# Patient Record
Sex: Female | Born: 1950 | Race: Black or African American | Hispanic: No | Marital: Single | Smoking: Never smoker
Health system: Southern US, Community
[De-identification: ages and names within clinical notes are randomized; demographics above are authoritative.]

## PROBLEM LIST (undated history)

## (undated) DIAGNOSIS — M1990 Unspecified osteoarthritis, unspecified site: Secondary | ICD-10-CM

## (undated) DIAGNOSIS — K219 Gastro-esophageal reflux disease without esophagitis: Secondary | ICD-10-CM

## (undated) DIAGNOSIS — I10 Essential (primary) hypertension: Secondary | ICD-10-CM

## (undated) DIAGNOSIS — B9689 Other specified bacterial agents as the cause of diseases classified elsewhere: Secondary | ICD-10-CM

## (undated) DIAGNOSIS — T7840XA Allergy, unspecified, initial encounter: Secondary | ICD-10-CM

## (undated) DIAGNOSIS — N760 Acute vaginitis: Secondary | ICD-10-CM

## (undated) HISTORY — DX: Unspecified osteoarthritis, unspecified site: M19.90

## (undated) HISTORY — DX: Gastro-esophageal reflux disease without esophagitis: K21.9

## (undated) HISTORY — PX: TOTAL VAGINAL HYSTERECTOMY: SHX2548

## (undated) HISTORY — DX: Allergy, unspecified, initial encounter: T78.40XA

## (undated) HISTORY — DX: Essential (primary) hypertension: I10

---
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
## 2012-05-01 ENCOUNTER — Encounter: Payer: Self-pay | Admitting: Obstetrics & Gynecology

## 2012-05-22 ENCOUNTER — Encounter: Payer: Self-pay | Admitting: Obstetrics & Gynecology

## 2012-05-22 ENCOUNTER — Ambulatory Visit (INDEPENDENT_AMBULATORY_CARE_PROVIDER_SITE_OTHER): Payer: Self-pay | Admitting: Obstetrics & Gynecology

## 2012-05-22 VITALS — BP 132/85 | HR 80 | Temp 98.3°F | Ht 63.0 in | Wt 231.9 lb

## 2012-05-22 DIAGNOSIS — B9689 Other specified bacterial agents as the cause of diseases classified elsewhere: Secondary | ICD-10-CM

## 2012-05-22 DIAGNOSIS — A499 Bacterial infection, unspecified: Secondary | ICD-10-CM

## 2012-05-22 DIAGNOSIS — N760 Acute vaginitis: Secondary | ICD-10-CM

## 2012-05-22 MED ORDER — CLINDAMYCIN HCL 150 MG PO CAPS: 150.0000 mg | ORAL_CAPSULE | Freq: Three times a day (TID) | ORAL | Status: DC

## 2012-05-22 NOTE — Progress Notes (Signed)
Patient ID: Leah Aguilar, female   DOB: [birth year], 62 y.o.   MRN: [id number]  Chief Complaint  Patient presents with  . Vaginal Discharge    Referral [facility]    HPI Leah Aguilar is a 62 y.o. female.  Chronic vaginal discharge for many years, previously treated for BV with metronidazole.  HPI  Past Medical History  Diagnosis Date  . Hypertension   . GERD (gastroesophageal reflux disease)   . Allergy   . Arthritis     Past Surgical History  Procedure Laterality Date  . Appendectomy  1958  . Total vaginal hysterectomy    D&C for Sab x2 BTL postpartum 1979 1985 [place]  Family History  Problem Relation Age of Onset  . Hypertension Mother   . Arthritis Mother   . COPD Mother   . Hypertension Father   . Cancer Father   . Arthritis Father     Social History History  Substance Use Topics  . Smoking status: Never Smoker   . Smokeless tobacco: Never Used  . Alcohol Use: No    No Known Allergies  Current Outpatient Prescriptions  Medication Sig Dispense Refill  . albuterol (PROVENTIL HFA;VENTOLIN HFA) 108 (90 BASE) MCG/ACT inhaler Inhale 2 puffs into the lungs every 6 (six) hours as needed for wheezing.      . clindamycin (CLEOCIN) 150 MG capsule Take 1 capsule (150 mg total) by mouth 3 (three) times daily.  21 capsule  0  . esomeprazole (NEXIUM) 20 MG capsule Take 20 mg by mouth daily before breakfast.      . Fluticasone-Salmeterol (ADVAIR DISKUS) 100-50 MCG/DOSE AEPB Inhale 1 puff into the lungs every 12 (twelve) hours.      Marland Kitchen PRESCRIPTION MEDICATION 2 tablets daily. Blood pressure - not sure name       No current facility-administered medications for this visit.    Review of Systems Review of Systems  Gastrointestinal: Negative for abdominal pain.  Genitourinary: Positive for vaginal discharge (odor). Negative for dysuria, vaginal bleeding, pelvic pain and dyspareunia.    Blood pressure 132/85, pulse 80, temperature  98.3 F (36.8 C), height 5\' 3"  (1.6 m), weight 231 lb 14.4 oz (105.189 kg).  Physical Exam Physical Exam  Constitutional: She is oriented to person, place, and time. She appears well-developed and well-nourished.  obese  Pulmonary/Chest: Effort normal. No respiratory distress.  Genitourinary: Vaginal discharge found.  Scant DC and odor Cuff normal and no mass Affirm test done  Neurological: She is alert and oriented to person, place, and time.  Skin: Skin is warm and dry.  Psychiatric: She has a normal mood and affect. Her behavior is normal.    Data Reviewed Referral notes  Assessment    Suspect BV     Plan    Clindamycin 150 mg TID 7 days        Leah Aguilar 05/22/2012, 3:54 PM

## 2012-05-22 NOTE — Patient Instructions (Signed)
Bacterial Vaginosis Bacterial vaginosis (BV) is a vaginal infection where the normal balance of bacteria in the vagina is disrupted. The normal balance is then replaced by an overgrowth of certain bacteria. There are several different kinds of bacteria that can cause BV. BV is the most common vaginal infection in women of childbearing age. CAUSES   The cause of BV is not fully understood. BV develops when there is an increase or imbalance of harmful bacteria.  Some activities or behaviors can upset the normal balance of bacteria in the vagina and put women at increased risk including:  Having a new sex partner or multiple sex partners.  Douching.  Using an intrauterine device (IUD) for contraception.  It is not clear what role sexual activity plays in the development of BV. However, women that have never had sexual intercourse are rarely infected with BV. Women do not get BV from toilet seats, bedding, swimming pools or from touching objects around them.  SYMPTOMS   Grey vaginal discharge.  A fish-like odor with discharge, especially after sexual intercourse.  Itching or burning of the vagina and vulva.  Burning or pain with urination.  Some women have no signs or symptoms at all. DIAGNOSIS  Your caregiver must examine the vagina for signs of BV. Your caregiver will perform lab tests and look at the sample of vaginal fluid through a microscope. They will look for bacteria and abnormal cells (clue cells), a pH test higher than 4.5, and a positive amine test all associated with BV.  RISKS AND COMPLICATIONS   Pelvic inflammatory disease (PID).  Infections following gynecology surgery.  Developing HIV.  Developing herpes virus. TREATMENT  Sometimes BV will clear up without treatment. However, all women with symptoms of BV should be treated to avoid complications, especially if gynecology surgery is planned. Female partners generally do not need to be treated. However, BV may spread  between female sex partners so treatment is helpful in preventing a recurrence of BV.   BV may be treated with antibiotics. The antibiotics come in either pill or vaginal cream forms. Either can be used with nonpregnant or pregnant women, but the recommended dosages differ. These antibiotics are not harmful to the baby.  BV can recur after treatment. If this happens, a second round of antibiotics will often be prescribed.  Treatment is important for pregnant women. If not treated, BV can cause a premature delivery, especially for a pregnant woman who had a premature birth in the past. All pregnant women who have symptoms of BV should be checked and treated.  For chronic reoccurrence of BV, treatment with a type of prescribed gel vaginally twice a week is helpful. HOME CARE INSTRUCTIONS   Finish all medication as directed by your caregiver.  Do not have sex until treatment is completed.  Tell your sexual partner that you have a vaginal infection. They should see their caregiver and be treated if they have problems, such as a mild rash or itching.  Practice safe sex. Use condoms. Only have 1 sex partner. PREVENTION  Basic prevention steps can help reduce the risk of upsetting the natural balance of bacteria in the vagina and developing BV:  Do not have sexual intercourse (be abstinent).  Do not douche.  Use all of the medicine prescribed for treatment of BV, even if the signs and symptoms go away.  Tell your sex partner if you have BV. That way, they can be treated, if needed, to prevent reoccurrence. SEEK MEDICAL CARE IF:     Your symptoms are not improving after 3 days of treatment.  You have increased discharge, pain, or fever. MAKE SURE YOU:   Understand these instructions.  Will watch your condition.  Will get help right away if you are not doing well or get worse. FOR MORE INFORMATION  Division of STD Prevention (DSTDP), Centers for Disease Control and Prevention:  www.cdc.gov/std American Social Health Association (ASHA): www.ashastd.org  Document Released: 03/13/2005 Document Revised: 06/05/2011 Document Reviewed: 09/03/2008 Exit[employer] Patient Information 2013 [employer].  

## 2012-05-22 NOTE — Progress Notes (Signed)
Referred her for c/o vaginal discharge for several years- states has been treated several times and it comes right back.

## 2012-05-22 NOTE — Addendum Note (Signed)
Addended by: Kathee Delton on: 05/22/2012 04:52 PM   Modules accepted: Orders

## 2012-06-27 ENCOUNTER — Telehealth: Payer: Self-pay | Admitting: *Deleted

## 2012-06-27 NOTE — Telephone Encounter (Signed)
Leah Aguilar called and left a message she was seen by Dr. Debroah Loop 05/22/12 and he said if the problem persisted she could get the medicine refilled. States she did the automated request on computer and it said call your doctor.  Per chart review was seen for persistent BV and given prescription for Clindamycin x 7 days.  Called Leah Aguilar and left a message we are calling back- please call clinic during office hours.

## 2012-07-03 NOTE — Telephone Encounter (Signed)
Called patient and received voice mail.  I left a message saying that we have some information for her and for her to give [place] a call back.  I also asked that if she gets our nurse line again to please leave in her message if it is ok or not for [place] to leave detailed information on her voicemail.

## 2012-07-03 NOTE — Telephone Encounter (Signed)
Patient called and left message stating she is returning our phone call and its okay to leave a detailed message on her voicemail

## 2012-07-04 NOTE — Telephone Encounter (Signed)
Left a voicemail for the patient informing her of her dx of persistent BV and that her medication had been called into the pharmacy of her choice.  I reviewed the instructions for taking the medication and stated if she had any further questions to please call us back.

## 2014-01-26 ENCOUNTER — Encounter: Payer: Self-pay | Admitting: Obstetrics & Gynecology

## 2014-12-16 ENCOUNTER — Encounter (HOSPITAL_BASED_OUTPATIENT_CLINIC_OR_DEPARTMENT_OTHER): Payer: Self-pay

## 2014-12-16 ENCOUNTER — Emergency Department (HOSPITAL_BASED_OUTPATIENT_CLINIC_OR_DEPARTMENT_OTHER)
Admission: EM | Admit: 2014-12-16 | Discharge: 2014-12-16 | Disposition: A | Discharge status: Home / Self Care | Payer: Medicare Other | Attending: Emergency Medicine | Admitting: Emergency Medicine

## 2014-12-16 DIAGNOSIS — Z792 Long term (current) use of antibiotics: Secondary | ICD-10-CM | POA: Diagnosis not present

## 2014-12-16 DIAGNOSIS — N390 Urinary tract infection, site not specified: Secondary | ICD-10-CM | POA: Diagnosis not present

## 2014-12-16 DIAGNOSIS — Z7951 Long term (current) use of inhaled steroids: Secondary | ICD-10-CM | POA: Diagnosis not present

## 2014-12-16 DIAGNOSIS — R300 Dysuria: Secondary | ICD-10-CM | POA: Diagnosis present

## 2014-12-16 DIAGNOSIS — I10 Essential (primary) hypertension: Secondary | ICD-10-CM | POA: Insufficient documentation

## 2014-12-16 DIAGNOSIS — L293 Anogenital pruritus, unspecified: Secondary | ICD-10-CM | POA: Diagnosis not present

## 2014-12-16 DIAGNOSIS — Z8739 Personal history of other diseases of the musculoskeletal system and connective tissue: Secondary | ICD-10-CM | POA: Insufficient documentation

## 2014-12-16 DIAGNOSIS — K219 Gastro-esophageal reflux disease without esophagitis: Secondary | ICD-10-CM | POA: Diagnosis not present

## 2014-12-16 DIAGNOSIS — Z8619 Personal history of other infectious and parasitic diseases: Secondary | ICD-10-CM | POA: Diagnosis not present

## 2014-12-16 DIAGNOSIS — Z79899 Other long term (current) drug therapy: Secondary | ICD-10-CM | POA: Diagnosis not present

## 2014-12-16 HISTORY — DX: Other specified bacterial agents as the cause of diseases classified elsewhere: B96.89

## 2014-12-16 HISTORY — DX: Acute vaginitis: N76.0

## 2014-12-16 LAB — URINE MICROSCOPIC-ADD ON

## 2014-12-16 LAB — URINALYSIS, ROUTINE W REFLEX MICROSCOPIC
BILIRUBIN URINE: NEGATIVE
Glucose, UA: NEGATIVE mg/dL
KETONES UR: 15 mg/dL — AB
NITRITE: NEGATIVE
Protein, ur: NEGATIVE mg/dL
SPECIFIC GRAVITY, URINE: 1.023 (ref 1.005–1.030)
UROBILINOGEN UA: 1 mg/dL (ref 0.0–1.0)
pH: 6.5 (ref 5.0–8.0)

## 2014-12-16 MED ORDER — NITROFURANTOIN MONOHYD MACRO 100 MG PO CAPS: 100.0000 mg | ORAL_CAPSULE | Freq: Two times a day (BID) | ORAL | Status: DC

## 2014-12-16 MED ORDER — NITROFURANTOIN MONOHYD MACRO 100 MG PO CAPS
100.0000 mg | ORAL_CAPSULE | Freq: Once | ORAL | Status: AC
  Administered 2014-12-16: 100 mg via ORAL
  Filled 2014-12-16: qty 1

## 2014-12-16 MED ORDER — PHENAZOPYRIDINE HCL 200 MG PO TABS: 200.0000 mg | ORAL_TABLET | Freq: Three times a day (TID) | ORAL | Status: DC

## 2014-12-16 MED ORDER — PHENAZOPYRIDINE HCL 100 MG PO TABS
200.0000 mg | ORAL_TABLET | Freq: Once | ORAL | Status: AC
  Administered 2014-12-16: 200 mg via ORAL
  Filled 2014-12-16: qty 2

## 2014-12-16 NOTE — ED Notes (Signed)
Pt c/o vaginal burning, itching and pain after having intercourse on 9/16. Pt states was tx'd for BV 1wk prior to this

## 2014-12-16 NOTE — ED Notes (Signed)
Pt verbalizes understanding of d/c instructions and denies any further needs at this time. 

## 2014-12-16 NOTE — ED Provider Notes (Signed)
CSN: [id number]     Arrival date & time 12/16/14  0113 History   First MD Initiated Contact with Patient 12/16/14 [phone]     Chief Complaint  Patient presents with  . Vaginal Itching     (Consider location/radiation/quality/duration/timing/severity/associated sxs/prior Treatment) Patient is a 64 y.o. female presenting with dysuria. The history is provided by the patient.  Dysuria Pain quality:  Burning Pain severity:  Moderate Onset quality:  Gradual Timing:  Constant Progression:  Unchanged Chronicity:  New Relieved by:  Nothing Worsened by:  Nothing tried Ineffective treatments:  None tried Urinary symptoms: no hematuria   Associated symptoms: no flank pain, no genital lesions and no vaginal discharge   Associated symptoms comment:  Suprapubic pain Risk factors: no hx of pyelonephritis     Past Medical History  Diagnosis Date  . Hypertension   . GERD (gastroesophageal reflux disease)   . Allergy   . Arthritis   . BV (bacterial vaginosis)    Past Surgical History  Procedure Laterality Date  . Appendectomy  1958  . Total vaginal hysterectomy     Family History  Problem Relation Age of Onset  . Hypertension Mother   . Arthritis Mother   . COPD Mother   . Hypertension Father   . Cancer Father   . Arthritis Father    Social History  Substance Use Topics  . Smoking status: Never Smoker   . Smokeless tobacco: Never Used  . Alcohol Use: No   OB History    Gravida Para Term Preterm AB TAB SAB Ectopic Multiple Living   Review of Systems  Genitourinary: Positive for dysuria. Negative for hematuria, flank pain, vaginal bleeding, vaginal discharge and menstrual problem.  All other systems reviewed and are negative.     Allergies  Review of patient's allergies indicates no known allergies.  Home Medications   Prior to Admission medications   Medication Sig Start Date End Date Taking? Authorizing Provider  albuterol (PROVENTIL  HFA;VENTOLIN HFA) 108 (90 BASE) MCG/ACT inhaler Inhale 2 puffs into the lungs every 6 (six) hours as needed for wheezing.    Historical Provider, MD  clindamycin (CLEOCIN) 150 MG capsule Take 1 capsule (150 mg total) by mouth 3 (three) times daily. 05/22/12   Adam Phenix, MD  esomeprazole (NEXIUM) 20 MG capsule Take 20 mg by mouth daily before breakfast.    Historical Provider, MD  Fluticasone-Salmeterol (ADVAIR DISKUS) 100-50 MCG/DOSE AEPB Inhale 1 puff into the lungs every 12 (twelve) hours.    Historical Provider, MD  PRESCRIPTION MEDICATION 2 tablets daily. Blood pressure - not sure name    Historical Provider, MD   BP 136/85 mmHg  Pulse 76  Temp(Src) 98.5 F (36.9 C) (Oral)  Resp 20  Ht  (1.6 m)  Wt 240 lb (108.863 kg)  BMI 42.52 kg/m2  SpO2 98% Physical Exam  Constitutional: She is oriented to person, place, and time. She appears well-developed and well-nourished. No distress.  HENT:  Head: Normocephalic and atraumatic.  Mouth/Throat: Oropharynx is clear and moist.  Eyes: Conjunctivae and EOM are normal. Pupils are equal, round, and reactive to light.  Neck: Normal range of motion. Neck supple.  Cardiovascular: Normal rate, regular rhythm and intact distal pulses.   Pulmonary/Chest: Effort normal and breath sounds normal. No respiratory distress. She has no wheezes. She has no rales.  Abdominal: Soft. Bowel sounds are increased. There is no tenderness. There  is no rebound and no guarding.  Musculoskeletal: Normal range of motion.  Neurological: She is alert and oriented to person, place, and time.  Skin: Skin is warm and dry.  Psychiatric: She has a normal mood and affect.    ED Course  Procedures (including critical care time) Labs Review Labs Reviewed  URINALYSIS, ROUTINE W REFLEX MICROSCOPIC (NOT AT [facility]) - Abnormal; Notable for the following:    APPearance CLOUDY (*)    Hgb urine dipstick SMALL (*)    Ketones, ur 15 (*)    Leukocytes, UA LARGE (*)    All other  components within normal limits  URINE MICROSCOPIC-ADD ON - Abnormal; Notable for the following:    Squamous Epithelial / LPF MANY (*)    Bacteria, UA FEW (*)    All other components within normal limits    Imaging Review No results found. I have personally reviewed and evaluated these images and lab results as part of my medical decision-making.   EKG Interpretation None      MDM   Final diagnoses:  None    UTI, as she states this is different than her BV and is on medication for same will treat for UTI and have patient follow up with her PMD.      April Palumbo, MD 12/16/14 1610

## 2016-04-12 ENCOUNTER — Encounter (HOSPITAL_BASED_OUTPATIENT_CLINIC_OR_DEPARTMENT_OTHER): Payer: Self-pay | Admitting: Emergency Medicine

## 2016-04-12 ENCOUNTER — Emergency Department (HOSPITAL_BASED_OUTPATIENT_CLINIC_OR_DEPARTMENT_OTHER): Payer: Medicare Other

## 2016-04-12 ENCOUNTER — Emergency Department (HOSPITAL_BASED_OUTPATIENT_CLINIC_OR_DEPARTMENT_OTHER)
Admission: EM | Admit: 2016-04-12 | Discharge: 2016-04-12 | Disposition: A | Discharge status: Home / Self Care | Payer: Medicare Other | Attending: Emergency Medicine | Admitting: Emergency Medicine

## 2016-04-12 DIAGNOSIS — R509 Fever, unspecified: Secondary | ICD-10-CM | POA: Diagnosis not present

## 2016-04-12 DIAGNOSIS — R062 Wheezing: Secondary | ICD-10-CM | POA: Diagnosis not present

## 2016-04-12 DIAGNOSIS — Z79899 Other long term (current) drug therapy: Secondary | ICD-10-CM | POA: Diagnosis not present

## 2016-04-12 DIAGNOSIS — R05 Cough: Secondary | ICD-10-CM | POA: Insufficient documentation

## 2016-04-12 DIAGNOSIS — I10 Essential (primary) hypertension: Secondary | ICD-10-CM | POA: Insufficient documentation

## 2016-04-12 DIAGNOSIS — R6889 Other general symptoms and signs: Secondary | ICD-10-CM

## 2016-04-12 DIAGNOSIS — R112 Nausea with vomiting, unspecified: Secondary | ICD-10-CM | POA: Diagnosis not present

## 2016-04-12 LAB — BASIC METABOLIC PANEL
ANION GAP: 7 (ref 5–15)
BUN: 9 mg/dL (ref 6–20)
CALCIUM: 8.8 mg/dL — AB (ref 8.9–10.3)
CO2: 22 mmol/L (ref 22–32)
Chloride: 107 mmol/L (ref 101–111)
Creatinine, Ser: 0.72 mg/dL (ref 0.44–1.00)
GFR calc Af Amer: >60 mL/min (ref >60)
GLUCOSE: 130 mg/dL — AB (ref 65–99)
Potassium: 3.4 mmol/L — ABNORMAL LOW (ref 3.5–5.1)
SODIUM: 136 mmol/L (ref 135–145)

## 2016-04-12 LAB — CBC WITH DIFFERENTIAL/PLATELET
Basophils Absolute: 0 10*3/uL (ref 0.0–0.1)
Basophils Relative: 0 %
Eosinophils Absolute: 0 10*3/uL (ref 0.0–0.7)
Eosinophils Relative: 0 %
HCT: 35.4 % — ABNORMAL LOW (ref 36.0–46.0)
Hemoglobin: 11.7 g/dL — ABNORMAL LOW (ref 12.0–15.0)
Lymphocytes Relative: 16 %
Lymphs Abs: 0.8 10*3/uL (ref 0.7–4.0)
MCH: 31.5 pg (ref 26.0–34.0)
MCHC: 33.1 g/dL (ref 30.0–36.0)
MCV: 95.2 fL (ref 78.0–100.0)
Monocytes Absolute: 0.4 10*3/uL (ref 0.1–1.0)
Monocytes Relative: 8 %
Neutro Abs: 4 10*3/uL (ref 1.7–7.7)
Neutrophils Relative %: 76 %
Platelets: 256 10*3/uL (ref 150–400)
RBC: 3.72 MIL/uL — ABNORMAL LOW (ref 3.87–5.11)
RDW: 12.8 % (ref 11.5–15.5)
WBC: 5.3 10*3/uL (ref 4.0–10.5)

## 2016-04-12 MED ORDER — ACETAMINOPHEN 500 MG PO TABS
1000.0000 mg | ORAL_TABLET | Freq: Once | ORAL | Status: AC
  Administered 2016-04-12: 1000 mg via ORAL
  Filled 2016-04-12: qty 2

## 2016-04-12 MED ORDER — ONDANSETRON HCL 4 MG/2ML IJ SOLN
4.0000 mg | Freq: Once | INTRAMUSCULAR | Status: AC
  Administered 2016-04-12: 4 mg via INTRAVENOUS

## 2016-04-12 MED ORDER — SODIUM CHLORIDE 0.9 % IV BOLUS (SEPSIS)
500.0000 mL | Freq: Once | INTRAVENOUS | Status: AC
  Administered 2016-04-12: 500 mL via INTRAVENOUS

## 2016-04-12 MED ORDER — IPRATROPIUM-ALBUTEROL 0.5-2.5 (3) MG/3ML IN SOLN
RESPIRATORY_TRACT | Status: AC
  Administered 2016-04-12: 3 mL
  Filled 2016-04-12: qty 3

## 2016-04-12 MED ORDER — ONDANSETRON HCL 4 MG/2ML IJ SOLN
INTRAMUSCULAR | Status: AC
  Administered 2016-04-12: 4 mg via INTRAVENOUS
  Filled 2016-04-12: qty 2

## 2016-04-12 MED ORDER — SODIUM CHLORIDE 0.9 % IV BOLUS (SEPSIS)
1000.0000 mL | Freq: Once | INTRAVENOUS | Status: AC
  Administered 2016-04-12: 1000 mL via INTRAVENOUS

## 2016-04-12 MED ORDER — IPRATROPIUM-ALBUTEROL 0.5-2.5 (3) MG/3ML IN SOLN
3.0000 mL | Freq: Once | RESPIRATORY_TRACT | Status: AC
  Administered 2016-04-12: 3 mL via RESPIRATORY_TRACT
  Filled 2016-04-12: qty 3

## 2016-04-12 MED ORDER — GUAIFENESIN 100 MG/5ML PO LIQD: 100.0000 mg | ORAL | 0 refills | Status: DC | PRN

## 2016-04-12 MED ORDER — ALBUTEROL SULFATE (2.5 MG/3ML) 0.083% IN NEBU
INHALATION_SOLUTION | RESPIRATORY_TRACT | Status: AC
  Administered 2016-04-12: 2.5 mg
  Filled 2016-04-12: qty 3

## 2016-04-12 MED ORDER — BENZONATATE 100 MG PO CAPS
100.0000 mg | ORAL_CAPSULE | Freq: Once | ORAL | Status: AC
  Administered 2016-04-12: 100 mg via ORAL
  Filled 2016-04-12: qty 1

## 2016-04-12 MED ORDER — ALBUTEROL SULFATE HFA 108 (90 BASE) MCG/ACT IN AERS: 1.0000 | INHALATION_SPRAY | Freq: Four times a day (QID) | RESPIRATORY_TRACT | 0 refills | Status: AC | PRN

## 2016-04-12 MED ORDER — BENZONATATE 100 MG PO CAPS: 100.0000 mg | ORAL_CAPSULE | Freq: Three times a day (TID) | ORAL | 0 refills | Status: DC

## 2016-04-12 MED FILL — BENZONATATE 100 MG CAP: 100 | 5 days supply | Qty: 15 | Fill #0

## 2016-04-12 MED FILL — ROBAFEN 100 MG/5 ML SYRUP: 100 | 2 days supply | Qty: 118 | Fill #0

## 2016-04-12 MED FILL — PROAIR HFA 90 MCG INHALER: 108 (90 BAS | 25 days supply | Qty: 9 | Fill #0

## 2016-04-12 NOTE — ED Notes (Signed)
Pt verbalized understanding of discharge instructions and denies any further questions at this time.   

## 2016-04-12 NOTE — ED Notes (Signed)
RT at bedside for assessment.

## 2016-04-12 NOTE — Discharge Instructions (Signed)
Your symptoms are consistent with the flu. Please make sure you stay hydrated with plenty of water. Use Tylenol and Motrin for fever and body aches. I have given a prescription for Tessalon which will help with cough. I have also giving a prescription for Robitussin to help with chest congestion please take as prescribed. Use the albuterol inhaler as needed for wheezing. Please follow-up with your primary care doctor in regards to today's visit. If your symptoms worsen including exertional chest pain, shortness of breath, worsening abdominal pain, worsening vomiting, worsening diarrhea or for any other reason please return to the ED.

## 2016-04-12 NOTE — ED Notes (Signed)
ED Provider at bedside. 

## 2016-04-12 NOTE — ED Provider Notes (Signed)
Medical screening examination/treatment/procedure(s) were conducted as a shared visit with non-physician practitioner(s) and myself.  I personally evaluated the patient during the encounter.   EKG Interpretation None      Results for orders placed or performed during the hospital encounter of 04/12/16  CBC with Differential  Result Value Ref Range   WBC 5.3 4.0 - 10.5 K/uL   RBC 3.72 (L) 3.87 - 5.11 MIL/uL   Hemoglobin 11.7 (L) 12.0 - 15.0 g/dL   HCT 16.135.4 (L) 09.636.0 - 04.546.0 %   MCV 95.2 78.0 - 100.0 fL   MCH 31.5 26.0 - 34.0 pg   MCHC 33.1 30.0 - 36.0 g/dL   RDW 40.912.8 81.111.5 - 91.415.5 %   Platelets 256 150 - 400 K/uL   Neutrophils Relative % 76 %   Neutro Abs 4.0 1.7 - 7.7 K/uL   Lymphocytes Relative 16 %   Lymphs Abs 0.8 0.7 - 4.0 K/uL   Monocytes Relative 8 %   Monocytes Absolute 0.4 0.1 - 1.0 K/uL   Eosinophils Relative 0 %   Eosinophils Absolute 0.0 0.0 - 0.7 K/uL   Basophils Relative 0 %   Basophils Absolute 0.0 0.0 - 0.1 K/uL   Dg Chest 2 View  Result Date: 04/12/2016 CLINICAL DATA:  Cough, fever EXAM: CHEST  2 VIEW COMPARISON:  None. FINDINGS: Normal heart size. Mildly tortuous thoracic aorta. Otherwise normal mediastinal contour. No pneumothorax. No pleural effusion. Lungs appear clear, with no acute consolidative airspace disease and no pulmonary edema. IMPRESSION: No active cardiopulmonary disease. Electronically Signed   By: Delbert PhenixJason A Poff M.D.   On: 04/12/2016 10:14    Patient nontoxic no acute distress. Symptoms consistent with influenza. Patient with onset of symptoms on Monday. So she is out of the window for Tamiflu. Chest x-ray negative for pneumonia. Patient without a leukocytosis. Patient feeling better with IV hydration here. Patient we treated symptomatically. Patient is alert no acute distress lungs clear without any significant wheezing. Abdomen soft and nontender. Heart slightly tachycardic slightly above 100.   Vanetta MuldersScott Kassity Woodson, MD 04/12/16 1100

## 2016-04-12 NOTE — ED Provider Notes (Signed)
[facility] Provider [facility]   CSN: [id number] Arrival date & time: 04/12/16  0910     History   Chief Complaint Chief Complaint  Patient presents with  . Cough  . Generalized Body Aches    HPI Leah Aguilar is a 66 y.o. female.  66 year old African-American female past medical history significant for hypertension and asthma presents to the ED today with complaint of URI symptoms and generalized body aches. Patient also reports subjective low grade fevers at home. Patient has not taken her temperature. She does report chills. She endorses a productive cough with yellow to green sputum. Patient states her symptoms started approximately 2 days ago. Patient has a history of asthma and has albuterol inhaler at home. She has been using her inhaler with little relief. She also reports mild wheezing. She denies any sick contacts. She denies getting a flu shot this year. States that she has had poor by mouth intake for the past 2 days. States she doesn't feel like eating. She endorses nausea. Denies any emesis. She has been using over-the-counter cold and flu medication with some relief. Patient also reports generalized body aches including upper back and chest secondary to cough. Denies any lightheadedness, dizziness, shortness of breath, abdominal pain, change in bowel habits, urinary symptoms, numbness/tingling.      Past Medical History:  Diagnosis Date  . Allergy   . Arthritis   . BV (bacterial vaginosis)   . GERD (gastroesophageal reflux disease)   . Hypertension     Patient Active Problem List   Diagnosis Date Noted  . BV (bacterial vaginosis) 05/22/2012    Past Surgical History:  Procedure Laterality Date  . APPENDECTOMY  1958  . TOTAL VAGINAL HYSTERECTOMY      OB History    Gravida Para Term Preterm AB Living   5 3 3   2 3    SAB TAB Ectopic Multiple Live Births   2       3       Home Medications    Prior to Admission medications   Medication  Sig Start Date End Date Taking? Authorizing Provider  Fluticasone-Salmeterol (ADVAIR DISKUS) 100-50 MCG/DOSE AEPB Inhale 1 puff into the lungs every 12 (twelve) hours.   Yes Historical Provider, MD  lisinopril (PRINIVIL,ZESTRIL) 5 MG tablet Take 5 mg by mouth 2 (two) times daily.   Yes Historical Provider, MD  loratadine (CLARITIN) 10 MG tablet Take 10 mg by mouth daily.   Yes Historical Provider, MD  albuterol (PROVENTIL HFA;VENTOLIN HFA) 108 (90 BASE) MCG/ACT inhaler Inhale 2 puffs into the lungs every 6 (six) hours as needed for wheezing.    Historical Provider, MD  clindamycin (CLEOCIN) 150 MG capsule Take 1 capsule (150 mg total) by mouth 3 (three) times daily. 05/22/12   Adam Phenix, MD  esomeprazole (NEXIUM) 20 MG capsule Take 20 mg by mouth daily before breakfast.    Historical Provider, MD  nitrofurantoin, macrocrystal-monohydrate, (MACROBID) 100 MG capsule Take 1 capsule (100 mg total) by mouth 2 (two) times daily. X 7 days 12/16/14   April Palumbo, MD  phenazopyridine (PYRIDIUM) 200 MG tablet Take 1 tablet (200 mg total) by mouth 3 (three) times daily. 12/16/14   Cy Blamer, MD  PRESCRIPTION MEDICATION 2 tablets daily. Blood pressure - not sure name    Historical Provider, MD    Family History Family History  Problem Relation Age of Onset  . Hypertension Mother   . Arthritis Mother   . COPD Mother   .  Hypertension Father   . Cancer Father   . Arthritis Father     Social History Social History  Substance Use Topics  . Smoking status: Never Smoker  . Smokeless tobacco: Never Used  . Alcohol use No     Allergies   Patient has no known allergies.   Review of Systems Review of Systems  Constitutional: Positive for appetite change, chills and fever.  HENT: Positive for congestion, postnasal drip, rhinorrhea, sinus pain and sore throat. Negative for ear pain.   Eyes: Negative for visual disturbance.  Respiratory: Positive for cough and wheezing. Negative for shortness  of breath.   Cardiovascular: Positive for chest pain (secondary to cough). Negative for palpitations.  Gastrointestinal: Positive for nausea and vomiting. Negative for abdominal pain and diarrhea.  Genitourinary: Negative for dysuria, frequency, hematuria and urgency.  Musculoskeletal: Positive for back pain.  Skin: Negative for color change.  Neurological: Negative for dizziness, syncope, weakness, light-headedness, numbness and headaches.     Physical Exam Updated Vital Signs BP 128/69   Pulse 102   Temp 99 F (37.2 C) (Oral)   Resp 18   Ht 5\' 3"  (1.6 m)   Wt 115.2 kg   SpO2 97%   BMI 44.99 kg/m   Physical Exam  Constitutional: She is oriented to person, place, and time. She appears well-developed and well-nourished. No distress.  Patient is nontoxic appearing and in no acute distress.  HENT:  Head: Normocephalic and atraumatic. Head is without raccoon's eyes.  Right Ear: Tympanic membrane, external ear and ear canal normal.  Left Ear: Tympanic membrane, external ear and ear canal normal.  Nose: Mucosal edema and rhinorrhea present. Right sinus exhibits no maxillary sinus tenderness and no frontal sinus tenderness. Left sinus exhibits no maxillary sinus tenderness and no frontal sinus tenderness.  Mouth/Throat: Uvula is midline. Mucous membranes are dry. Posterior oropharyngeal erythema present. No oropharyngeal exudate, posterior oropharyngeal edema or tonsillar abscesses.  Eyes: Conjunctivae and EOM are normal. Pupils are equal, round, and reactive to light. Right eye exhibits no discharge. Left eye exhibits no discharge. No scleral icterus.  Neck: Normal range of motion. Neck supple. No thyromegaly present.  Cardiovascular: Normal rate, regular rhythm, normal heart sounds and intact distal pulses.  Exam reveals no gallop and no friction rub.   No murmur heard. Patient slightly tachycardic.  Pulmonary/Chest: Effort normal and breath sounds normal. No respiratory distress.    Expiratory wheezes noted. Patient not in respiratory distress. She is not hypoxic or tachypnea. No accessory muscle use.  Abdominal: Soft. Bowel sounds are normal. She exhibits no distension. There is no tenderness. There is no rigidity, no rebound, no guarding and no CVA tenderness.  Musculoskeletal: Normal range of motion.  Lymphadenopathy:    She has no cervical adenopathy.  Neurological: She is alert and oriented to person, place, and time.  Skin: Skin is warm and dry. Capillary refill takes less than 2 seconds.  Nursing note and vitals reviewed.    ED Treatments / Results  Labs (all labs ordered are listed, but only abnormal results are displayed) Labs Reviewed  CBC WITH DIFFERENTIAL/PLATELET - Abnormal; Notable for the following:       Result Value   RBC 3.72 (*)    Hemoglobin 11.7 (*)    HCT 35.4 (*)    All other components within normal limits  BASIC METABOLIC PANEL - Abnormal; Notable for the following:    Potassium 3.4 (*)    Glucose, Bld 130 (*)    Calcium  8.8 (*)    All other components within normal limits    EKG  EKG Interpretation None       Radiology Dg Chest 2 View  Result Date: 04/12/2016 CLINICAL DATA:  Cough, fever EXAM: CHEST  2 VIEW COMPARISON:  None. FINDINGS: Normal heart size. Mildly tortuous thoracic aorta. Otherwise normal mediastinal contour. No pneumothorax. No pleural effusion. Lungs appear clear, with no acute consolidative airspace disease and no pulmonary edema. IMPRESSION: No active cardiopulmonary disease. Electronically Signed   By: Delbert PhenixJason A Poff M.D.   On: 04/12/2016 10:14    Procedures Procedures (including critical care time)  Medications Ordered in ED Medications  sodium chloride 0.9 % bolus 500 mL (not administered)  albuterol (PROVENTIL) (2.5 MG/3ML) 0.083% nebulizer solution (2.5 mg  Given 04/12/16 0933)  ipratropium-albuterol (DUONEB) 0.5-2.5 (3) MG/3ML nebulizer solution (3 mLs  Given 04/12/16 0933)  acetaminophen (TYLENOL)  tablet 1,000 mg (1,000 mg Oral Given 04/12/16 0947)  sodium chloride 0.9 % bolus 1,000 mL (0 mLs Intravenous Stopped 04/12/16 1042)  ondansetron (ZOFRAN) injection 4 mg (4 mg Intravenous Given 04/12/16 0956)  ipratropium-albuterol (DUONEB) 0.5-2.5 (3) MG/3ML nebulizer solution 3 mL (3 mLs Nebulization Given 04/12/16 1039)  benzonatate (TESSALON) capsule 100 mg (100 mg Oral Given 04/12/16 1042)     Initial Impression / Assessment and Plan / ED Course  I have reviewed the triage vital signs and the nursing notes.  Pertinent labs & imaging results that were available during my care of the patient were reviewed by me and considered in my medical decision making (see chart for details).  Clinical Course   Patient presented to the ED with complaints of URI symptoms and generalized body aches. Subjective fevers at home. She also complains of a productive cough. Patient's symptoms are consistent with influenza. Her symptoms started on Monday so she is outside the window for Tamiflu. Patient with mild expiratory wheezes on lung exam. Chest x-ray was negative for any focal infiltrate. Patient given DuoNeb treatment 2 with significant improvement in wheezing. Patient without any leukocytosis. All other labs were unremarkable. She was given IV hydration with significant improvement. Abdominal exam is soft and nontender. She was slightly tachycardic in triage. Low grade temp. Patient was given Tylenol as she is not taking any prior to arrival. Patient's heart rate at discharge 102. This is likely due to DuoNeb treatments 2. Patient is nontoxic appearing and in no acute distress. She feels stable for discharge. Afebrile on discharge. Pt is hemodynamically stable, in NAD, & able to ambulate in the ED. Pain has been managed & has no complaints prior to dc. Pt is comfortable with above plan and is stable for discharge at this time. All questions were answered prior to disposition. Strict return precautions for f/u to the  ED were discussed. Pt seen and examined by Dr. Deretha EmoryZackowski who is agreeable to the above plan.   Final Clinical Impressions(s) / ED Diagnoses   Final diagnoses:  Flu-like symptoms    New Prescriptions Discharge Medication List as of 04/12/2016 11:50 AM    START taking these medications   Details  benzonatate (TESSALON) 100 MG capsule Take 1 capsule (100 mg total) by mouth every 8 (eight) hours., Starting Wed 04/12/2016, Print    guaiFENesin (ROBITUSSIN) 100 MG/5ML liquid Take 5-10 mLs (100-200 mg total) by mouth every 4 (four) hours as needed for cough., Starting Wed 04/12/2016, Print         Rise MuKenneth T Michiko Lineman, PA-C 04/12/16 1219    Vanetta MuldersScott Zackowski, MD  04/12/16 1221  

## 2016-04-12 NOTE — ED Triage Notes (Signed)
Pt with URI and generalized body ache since Monday. Productive cough with low grade fever.

## 2016-04-28 ENCOUNTER — Encounter (HOSPITAL_BASED_OUTPATIENT_CLINIC_OR_DEPARTMENT_OTHER): Payer: Self-pay | Admitting: Emergency Medicine

## 2016-04-28 ENCOUNTER — Emergency Department (HOSPITAL_BASED_OUTPATIENT_CLINIC_OR_DEPARTMENT_OTHER)
Admission: EM | Admit: 2016-04-28 | Discharge: 2016-04-28 | Disposition: A | Discharge status: Home / Self Care | Payer: Medicare Other | Attending: Emergency Medicine | Admitting: Emergency Medicine

## 2016-04-28 ENCOUNTER — Emergency Department (HOSPITAL_BASED_OUTPATIENT_CLINIC_OR_DEPARTMENT_OTHER): Payer: Medicare Other

## 2016-04-28 DIAGNOSIS — R519 Headache, unspecified: Secondary | ICD-10-CM

## 2016-04-28 DIAGNOSIS — I10 Essential (primary) hypertension: Secondary | ICD-10-CM | POA: Insufficient documentation

## 2016-04-28 DIAGNOSIS — M542 Cervicalgia: Secondary | ICD-10-CM

## 2016-04-28 DIAGNOSIS — R51 Headache: Secondary | ICD-10-CM | POA: Diagnosis present

## 2016-04-28 DIAGNOSIS — Z79899 Other long term (current) drug therapy: Secondary | ICD-10-CM | POA: Insufficient documentation

## 2016-04-28 LAB — CBC WITH DIFFERENTIAL/PLATELET
BASOS PCT: 1 %
Basophils Absolute: 0 10*3/uL (ref 0.0–0.1)
EOS ABS: 0.1 10*3/uL (ref 0.0–0.7)
EOS PCT: 3 %
HCT: 38 % (ref 36.0–46.0)
Hemoglobin: 12.6 g/dL (ref 12.0–15.0)
LYMPHS ABS: 2.2 10*3/uL (ref 0.7–4.0)
Lymphocytes Relative: 55 %
MCH: 31.5 pg (ref 26.0–34.0)
MCHC: 33.2 g/dL (ref 30.0–36.0)
MCV: 95 fL (ref 78.0–100.0)
MONOS PCT: 11 %
Monocytes Absolute: 0.4 10*3/uL (ref 0.1–1.0)
NEUTROS PCT: 31 %
Neutro Abs: 1.2 10*3/uL — ABNORMAL LOW (ref 1.7–7.7)
PLATELETS: 331 10*3/uL (ref 150–400)
RBC: 4 MIL/uL (ref 3.87–5.11)
RDW: 12.7 % (ref 11.5–15.5)
WBC: 4 10*3/uL (ref 4.0–10.5)

## 2016-04-28 LAB — BASIC METABOLIC PANEL
Anion gap: 6 (ref 5–15)
BUN: 10 mg/dL (ref 6–20)
CALCIUM: 9 mg/dL (ref 8.9–10.3)
CO2: 25 mmol/L (ref 22–32)
Chloride: 111 mmol/L (ref 101–111)
Creatinine, Ser: 0.83 mg/dL (ref 0.44–1.00)
GFR calc Af Amer: >60 mL/min (ref >60)
GLUCOSE: 94 mg/dL (ref 65–99)
POTASSIUM: 4.1 mmol/L (ref 3.5–5.1)
SODIUM: 142 mmol/L (ref 135–145)

## 2016-04-28 MED ORDER — METHOCARBAMOL 500 MG PO TABS: 500.0000 mg | ORAL_TABLET | Freq: Two times a day (BID) | ORAL | 0 refills | Status: DC

## 2016-04-28 MED FILL — tiZANidine HCL 4 MG TABS: 4 | 10 days supply | Qty: 20 | Fill #0

## 2016-04-28 NOTE — ED Provider Notes (Signed)
[facility]   CSN: [id number] Arrival date & time: 2/2/[id number]  0844     History   Chief Complaint Chief Complaint  Patient presents with  . Headache  . Neck Pain    HPI Leah Aguilar is a 66 y.o. female.  The history is provided by the patient and medical records.  Headache    Neck Pain   Associated symptoms include headaches.   66 y.o. F with hx of allergies, arthritis, GERD, HTN, presenting to the ED for headache.  Patient reports this is been ongoing for the past 2 weeks. Reports in mid January she was diagnosed with the flu from the emergency room. States she has not been "well" since this time. She reports cough and breathing issues seem to have resolved, however now she has aching, dull pain in the back of her head and the back of her neck. She denies any fever. States she did have a little bit of blurred vision a few days ago, none currently. She denies any confusion, focal numbness, focal weakness, difficulty walking, speech disturbance, tinnitus, or loss of vision.  Patient does report she has not been taking her blood pressure medications regularly. States she has been somewhat "emotional" recently so she has been forgetting it. Her blood pressure was elevated on arrival here.  She is not currently on anti-coagulation.  States she has tried taking aleve at home without relief of pain.  Patient was able to drive herself to the ER today.  She is requesting coffee to drink during exam.  She further expresses to me that she looked up symptoms on the Internet and wanted to make sure she didn't have meningitis.  Past Medical History:  Diagnosis Date  . Allergy   . Arthritis   . BV (bacterial vaginosis)   . GERD (gastroesophageal reflux disease)   . Hypertension     Patient Active Problem List   Diagnosis Date Noted  . BV (bacterial vaginosis) 05/22/2012    Past Surgical History:  Procedure Laterality Date  . APPENDECTOMY  1958  . TOTAL  VAGINAL HYSTERECTOMY      OB History    Gravida Para Term Preterm AB Living   5 3 3   2 3    SAB TAB Ectopic Multiple Live Births   2       3       Home Medications    Prior to Admission medications   Medication Sig Start Date End Date Taking? Authorizing Provider  albuterol (PROVENTIL HFA;VENTOLIN HFA) 108 (90 Base) MCG/ACT inhaler Inhale 1-2 puffs into the lungs every 6 (six) hours as needed for wheezing or shortness of breath. 04/12/16   Rise MuKenneth T Leaphart, PA-C  benzonatate (TESSALON) 100 MG capsule Take 1 capsule (100 mg total) by mouth every 8 (eight) hours. 04/12/16   Rise MuKenneth T Leaphart, PA-C  esomeprazole (NEXIUM) 20 MG capsule Take 20 mg by mouth daily before breakfast.    Historical Provider, MD  Fluticasone-Salmeterol (ADVAIR DISKUS) 100-50 MCG/DOSE AEPB Inhale 1 puff into the lungs every 12 (twelve) hours.    Historical Provider, MD  guaiFENesin (ROBITUSSIN) 100 MG/5ML liquid Take 5-10 mLs (100-200 mg total) by mouth every 4 (four) hours as needed for cough. 04/12/16   Rise MuKenneth T Leaphart, PA-C  lisinopril (PRINIVIL,ZESTRIL) 5 MG tablet Take 5 mg by mouth 2 (two) times daily.    Historical Provider, MD  loratadine (CLARITIN) 10 MG tablet Take 10 mg by mouth daily.    Historical Provider,  MD  PRESCRIPTION MEDICATION 2 tablets daily. Blood pressure - not sure name    Historical Provider, MD    Family History Family History  Problem Relation Age of Onset  . Hypertension Mother   . Arthritis Mother   . COPD Mother   . Hypertension Father   . Cancer Father   . Arthritis Father     Social History Social History  Substance Use Topics  . Smoking status: Never Smoker  . Smokeless tobacco: Never Used  . Alcohol use No     Allergies   Patient has no known allergies.   Review of Systems Review of Systems  Musculoskeletal: Positive for neck pain.  Neurological: Positive for headaches.  All other systems reviewed and are negative.    Physical Exam Updated Vital  Signs BP 164/99 (BP Location: Left Arm) Comment: Pt not been taking BP medications  Pulse 82   Temp 98.7 F (37.1 C) (Oral)   Resp 18   Ht 5\' 3"  (1.6 m)   Wt 115.2 kg   SpO2 100%   BMI 44.99 kg/m   Physical Exam  Constitutional: She is oriented to person, place, and time. She appears well-developed and well-nourished. No distress.  Non-toxic in appearance, interactive and joking during exam  HENT:  Head: Normocephalic and atraumatic.  Right Ear: Tympanic membrane and external ear normal.  Left Ear: Tympanic membrane and external ear normal.  Nose: Mucosal edema and rhinorrhea (clear) present.  Mouth/Throat: Uvula is midline, oropharynx is clear and moist and mucous membranes are normal.  Eyes: Conjunctivae and EOM are normal. Pupils are equal, round, and reactive to light.  Neck: Normal range of motion and full passive range of motion without pain. Neck supple. Muscular tenderness present. No neck rigidity.  No rigidity, no meningismus; full ROM in all directions TTP of musculature of the neck; there is no midline step-off or deformity  Cardiovascular: Normal rate, regular rhythm and normal heart sounds.   No murmur heard. Pulmonary/Chest: Effort normal and breath sounds normal. No respiratory distress. She has no wheezes. She has no rhonchi.  Abdominal: Soft. Bowel sounds are normal. There is no tenderness. There is no guarding.  Musculoskeletal: Normal range of motion. She exhibits no edema.  Neurological: She is alert and oriented to person, place, and time. She has normal strength. She displays no tremor. No cranial nerve deficit or sensory deficit. She displays no seizure activity.  AAOx3, answering questions and following commands appropriately; equal strength UE and LE bilaterally; CN grossly intact; moves all extremities appropriately without ataxia; no focal neuro deficits or facial asymmetry appreciated  Skin: Skin is warm and dry. No rash noted. She is not diaphoretic.    Psychiatric: She has a normal mood and affect. Her behavior is normal. Thought content normal.  Nursing note and vitals reviewed.    ED Treatments / Results  Labs (all labs ordered are listed, but only abnormal results are displayed) Labs Reviewed  CBC WITH DIFFERENTIAL/PLATELET - Abnormal; Notable for the following:       Result Value   Neutro Abs 1.2 (*)    All other components within normal limits  BASIC METABOLIC PANEL    EKG  EKG Interpretation None       Radiology Ct Head Wo Contrast  Result Date: 04/28/2016 CLINICAL DATA:  Pain in the back of the head radiating into the neck for 2 weeks. EXAM: CT HEAD WITHOUT CONTRAST TECHNIQUE: Contiguous axial images were obtained from the base of the skull  through the vertex without intravenous contrast. COMPARISON:  None. FINDINGS: Brain: Moderate white matter changes are identified. No acute cortical ischemia or infarct identified. Ventricles and sulci are normal. No mass, mass effect, or midline shift. Cerebellum, brainstem, and basal cisterns are normal. Vascular: No hyperdense vessel or unexpected calcification. Skull: Normal. Negative for fracture or focal lesion. Sinuses/Orbits: No acute finding. Other: None. IMPRESSION: 1. No acute intracranial abnormalities to explain the patient's pain are identified. Electronically Signed   By: Gerome Sam III M.D   On: 04/28/2016 09:54    Procedures Procedures (including critical care time)  Medications Ordered in ED Medications - No data to display   Initial Impression / Assessment and Plan / ED Course  I have reviewed the triage vital signs and the nursing notes.  Pertinent labs & imaging results that were available during my care of the patient were reviewed by me and considered in my medical decision making (see chart for details).  66 y.o. F here with headache and neck pain.  Was concerned for meningitis after looking up her symptoms on the Internet.  Recent influenza in Jan  2018.  Patient is afebrile, non-toxic in appearance here.  Neurologically intact here, no noted deficits.  Full ROM of the neck without rigidity.  She does have muscular tenderness of the cervical parapsinal muscles.  No midline step-off or deformity.  Reported some blurred vision a few days ago, none today.  Has not been compliant with her BP meds.  CT head negative for acute findings.  Lab work reassuring.  I have fairly suspicion for acute intracranial pathology such as TIA, CVA, ICH, SAH, or meningitis. Her headache and neck pain are likely a sequela of her recent influenza.  Patient strongly encouraged to take her BP meds regularly as this may contribute to headache as well.  Can try muscular relaxer for pain as well as heat therapy.  FU with PCP.  Discussed plan with patient, she acknowledged understanding and agreed with plan of care.  Return precautions given for new or worsening symptoms.  Final Clinical Impressions(s) / ED Diagnoses   Final diagnoses:  Nonintractable headache, unspecified chronicity pattern, unspecified headache type  Neck pain    New Prescriptions New Prescriptions   METHOCARBAMOL (ROBAXIN) 500 MG TABLET    Take 1 tablet (500 mg total) by mouth 2 (two) times daily.     Garlon Hatchet, PA-C 04/28/16 1105    Vanetta Mulders, MD 05/04/16 [phone]

## 2016-04-28 NOTE — Discharge Instructions (Signed)
Take the prescribed medication as directed.  Can also try warm compresses at home (heating pad, warm washcloth, etc) to help with muscle tensions. Make sure to take your blood pressure medications regularly.  This can cause headaches if your pressure is out of control. Follow-up with your primary care doctor. Return to the ED for new or worsening symptoms.

## 2016-04-28 NOTE — ED Triage Notes (Signed)
Pt states she is having headache in the back of her neck.  Pt denies fever, denies worst pain in her life.

## 2016-04-28 NOTE — ED Provider Notes (Signed)
Medical screening examination/treatment/procedure(s) were conducted as a shared visit with non-physician practitioner(s) and myself.  I personally evaluated the patient during the encounter.   EKG Interpretation None      Results for orders placed or performed during the hospital encounter of 04/28/16  CBC with Differential  Result Value Ref Range   WBC 4.0 4.0 - 10.5 K/uL   RBC 4.00 3.87 - 5.11 MIL/uL   Hemoglobin 12.6 12.0 - 15.0 g/dL   HCT 16.138.0 09.636.0 - 04.546.0 %   MCV 95.0 78.0 - 100.0 fL   MCH 31.5 26.0 - 34.0 pg   MCHC 33.2 30.0 - 36.0 g/dL   RDW 40.912.7 81.111.5 - 91.415.5 %   Platelets 331 150 - 400 K/uL   Neutrophils Relative % 31 %   Neutro Abs 1.2 (L) 1.7 - 7.7 K/uL   Lymphocytes Relative 55 %   Lymphs Abs 2.2 0.7 - 4.0 K/uL   Monocytes Relative 11 %   Monocytes Absolute 0.4 0.1 - 1.0 K/uL   Eosinophils Relative 3 %   Eosinophils Absolute 0.1 0.0 - 0.7 K/uL   Basophils Relative 1 %   Basophils Absolute 0.0 0.0 - 0.1 K/uL   WBC Morphology VACUOLATED NEUTROPHILS   Basic metabolic panel  Result Value Ref Range   Sodium 142 135 - 145 mmol/L   Potassium 4.1 3.5 - 5.1 mmol/L   Chloride 111 101 - 111 mmol/L   CO2 25 22 - 32 mmol/L   Glucose, Bld 94 65 - 99 mg/dL   BUN 10 6 - 20 mg/dL   Creatinine, Ser 7.820.83 0.44 - 1.00 mg/dL   Calcium 9.0 8.9 - 95.610.3 mg/dL   GFR calc non Af Amer >60 >60 mL/min   GFR calc Af Amer >60 >60 mL/min   Anion gap 6 5 - 15   Dg Chest 2 View  Result Date: 04/12/2016 CLINICAL DATA:  Cough, fever EXAM: CHEST  2 VIEW COMPARISON:  None. FINDINGS: Normal heart size. Mildly tortuous thoracic aorta. Otherwise normal mediastinal contour. No pneumothorax. No pleural effusion. Lungs appear clear, with no acute consolidative airspace disease and no pulmonary edema. IMPRESSION: No active cardiopulmonary disease. Electronically Signed   By: Delbert PhenixJason A Poff M.D.   On: 04/12/2016 10:14   Ct Head Wo Contrast  Result Date: 04/28/2016 CLINICAL DATA:  Pain in the back of the head  radiating into the neck for 2 weeks. EXAM: CT HEAD WITHOUT CONTRAST TECHNIQUE: Contiguous axial images were obtained from the base of the skull through the vertex without intravenous contrast. COMPARISON:  None. FINDINGS: Brain: Moderate white matter changes are identified. No acute cortical ischemia or infarct identified. Ventricles and sulci are normal. No mass, mass effect, or midline shift. Cerebellum, brainstem, and basal cisterns are normal. Vascular: No hyperdense vessel or unexpected calcification. Skull: Normal. Negative for fracture or focal lesion. Sinuses/Orbits: No acute finding. Other: None. IMPRESSION: 1. No acute intracranial abnormalities to explain the patient's pain are identified. Electronically Signed   By: Gerome Samavid  Williams III M.D   On: 04/28/2016 09:54   Patient seen by me along with the physician assistant. As well as saw the patient 2 weeks ago with a flulike illness. Patient was having a lot of coughing. Patient now complaint of right-sided neck pain for 2 weeks. The flu symptoms have improved significantly. Patient denies any fevers. Patient's concern was for possible meningitis. She had looked things up on the Internet.  Symptoms not consistent with that labs her negative head CT without any acute  findings. Patient nontoxic patient stable for discharge home. Suspect it may be due to muscle soreness either from the persistent cough for the flulike symptoms or just residual bodyaches.  Patient without any significant neck stiffness.   Vanetta Mulders, MD 04/28/16 1049

## 2016-11-10 ENCOUNTER — Encounter (HOSPITAL_BASED_OUTPATIENT_CLINIC_OR_DEPARTMENT_OTHER): Payer: Self-pay

## 2016-11-10 ENCOUNTER — Emergency Department (HOSPITAL_BASED_OUTPATIENT_CLINIC_OR_DEPARTMENT_OTHER)
Admission: EM | Admit: 2016-11-10 | Discharge: 2016-11-10 | Disposition: A | Discharge status: Home / Self Care | Payer: Medicare Other | Attending: Emergency Medicine | Admitting: Emergency Medicine

## 2016-11-10 DIAGNOSIS — I10 Essential (primary) hypertension: Secondary | ICD-10-CM | POA: Insufficient documentation

## 2016-11-10 DIAGNOSIS — Z79899 Other long term (current) drug therapy: Secondary | ICD-10-CM | POA: Diagnosis not present

## 2016-11-10 DIAGNOSIS — M25521 Pain in right elbow: Secondary | ICD-10-CM | POA: Insufficient documentation

## 2016-11-10 NOTE — ED Provider Notes (Signed)
[facility]   CSN: [id number] Arrival date & time: 11/10/16  1505     History   Chief Complaint Chief Complaint  Patient presents with  . Elbow Pain    HPI Leah Aguilar is a 66 y.o. female.Complains of right elbow pain for one week and a "knot" the lateral aspect of her right elbow. Pain is worse when she moves her elbow. Improved with remaining still. No other complaint. No injury.. No fever. Treated herself with Tylenol without relief. She is presently asymptomatic and does not feel "thek not"  HPI  Past Medical History:  Diagnosis Date  . Allergy   . Arthritis   . BV (bacterial vaginosis)   . GERD (gastroesophageal reflux disease)   . Hypertension     Patient Active Problem List   Diagnosis Date Noted  . BV (bacterial vaginosis) 05/22/2012    Past Surgical History:  Procedure Laterality Date  . APPENDECTOMY  1958  . TOTAL VAGINAL HYSTERECTOMY      OB History    Gravida Para Term Preterm AB Living   5 3 3   2 3    SAB TAB Ectopic Multiple Live Births   2       3       Home Medications    Prior to Admission medications   Medication Sig Start Date End Date Taking? Authorizing Provider  albuterol (PROVENTIL HFA;VENTOLIN HFA) 108 (90 Base) MCG/ACT inhaler Inhale 1-2 puffs into the lungs every 6 (six) hours as needed for wheezing or shortness of breath. 04/12/16   Rise Mu, PA-C  esomeprazole (NEXIUM) 20 MG capsule Take 20 mg by mouth daily before breakfast.    [provider]  Fluticasone-Salmeterol (ADVAIR DISKUS) 100-50 MCG/DOSE AEPB Inhale 1 puff into the lungs every 12 (twelve) hours.    [provider]  lisinopril (PRINIVIL,ZESTRIL) 5 MG tablet Take 5 mg by mouth 2 (two) times daily.    [provider]  loratadine (CLARITIN) 10 MG tablet Take 10 mg by mouth daily.    [provider]    Family History Family History  Problem Relation Age of Onset  . Hypertension Mother   .  Arthritis Mother   . COPD Mother   . Hypertension Father   . Cancer Father   . Arthritis Father     Social History Social History  Substance Use Topics  . Smoking status: Never Smoker  . Smokeless tobacco: Never Used  . Alcohol use No     Allergies   Patient has no known allergies.   Review of Systems Review of Systems  Constitutional: Negative.   Musculoskeletal: Positive for arthralgias.       Right elbow pain  Allergic/Immunologic: Negative.   Neurological: Negative.      Physical Exam Updated Vital Signs BP 122/83 (BP Location: Left Arm)   Pulse 76   Temp 98.7 F (37.1 C) (Oral)   Resp 18   Ht 5\' 3"  (1.6 m)   Wt 112.4 kg (247 lb 12.8 oz)   SpO2 99%   BMI 43.90 kg/m   Physical Exam  Constitutional: She appears well-developed and well-nourished. No distress.  HENT:  Head: Normocephalic and atraumatic.  Neck: Neck supple.  Cardiovascular: Normal rate.   Pulmonary/Chest: Effort normal.  Abdominal:  obese  Musculoskeletal:  Right upper extremity skin intact. Skin normal color and temperature. No mass. Full range of motion. Radial pulse 2+. Good capillary refill. All other extremities are redness swelling or tenderness  neurovascularly intact  Nursing note and vitals reviewed.    ED Treatments / Results  Labs (all labs ordered are listed, but only abnormal results are displayed) Labs Reviewed - No data to display  EKG  EKG Interpretation None       Radiology No results found.  Procedures Procedures (including critical care time)  Medications Ordered in ED Medications - No data to display   Initial Impression / Assessment and Plan / ED Course  I have reviewed the triage vital signs and the nursing notes.  Pertinent labs & imaging results that were available during my care of the patient were reviewed by me and considered in my medical decision making (see chart for details).     All symptoms resolved prior to my exam and interview  with this patient. Plan Tylenol as needed. Follow-up with PMD as needed. No further workup needed.  Final Clinical Impressions(s) / ED Diagnoses  Diagnosis right elbow pain Final diagnoses:  Right elbow pain    New Prescriptions New Prescriptions   No medications on file     Doug Sou, MD 11/10/16 [phone]

## 2016-11-10 NOTE — ED Triage Notes (Signed)
C/o "knot" to right elbow x 1 week-denies injury-NAD-steady gait

## 2016-11-10 NOTE — Discharge Instructions (Signed)
Take Tylenol as directed. If discomfort continues in a week follow up with Dr. Enid Derry in the office. Return if concern for any reason

## 2020-06-21 ENCOUNTER — Emergency Department (HOSPITAL_BASED_OUTPATIENT_CLINIC_OR_DEPARTMENT_OTHER)
Admission: EM | Admit: 2020-06-21 | Discharge: 2020-06-22 | Disposition: A | Discharge status: Home / Self Care | Payer: Medicare Other | Attending: Emergency Medicine | Admitting: Emergency Medicine

## 2020-06-21 ENCOUNTER — Emergency Department (HOSPITAL_BASED_OUTPATIENT_CLINIC_OR_DEPARTMENT_OTHER): Payer: Medicare Other

## 2020-06-21 ENCOUNTER — Encounter (HOSPITAL_BASED_OUTPATIENT_CLINIC_OR_DEPARTMENT_OTHER): Payer: Self-pay | Admitting: *Deleted

## 2020-06-21 DIAGNOSIS — I10 Essential (primary) hypertension: Secondary | ICD-10-CM | POA: Insufficient documentation

## 2020-06-21 DIAGNOSIS — Z79899 Other long term (current) drug therapy: Secondary | ICD-10-CM | POA: Insufficient documentation

## 2020-06-21 DIAGNOSIS — M25561 Pain in right knee: Secondary | ICD-10-CM | POA: Diagnosis present

## 2020-06-21 MED ORDER — LIDOCAINE 5 % EX PTCH
1.0000 | MEDICATED_PATCH | CUTANEOUS | Status: DC
  Administered 2020-06-21: 1 via TRANSDERMAL
  Filled 2020-06-21: qty 1

## 2020-06-21 MED ORDER — LIDOCAINE 5 % EX PTCH: 1.0000 | MEDICATED_PATCH | CUTANEOUS | 0 refills | Status: AC

## 2020-06-21 NOTE — ED Triage Notes (Signed)
This is Pt. R knee not L knee

## 2020-06-21 NOTE — ED Provider Notes (Signed)
[facility] EMERGENCY DEPARTMENT Provider Note   CSN: [id number] Arrival date & time: 06/21/20  2032     History Chief Complaint  Patient presents with  . Knee Pain    Leah Aguilar is a 70 y.o. female.  The history is provided by the patient.  Knee Pain Location:  Knee Time since incident:  4 months Injury: no   Knee location:  R knee Pain details:    Quality:  Aching   Radiates to:  Does not radiate   Severity:  Severe   Onset quality:  Sudden   Timing:  Constant Chronicity:  Chronic Prior injury to area:  No Relieved by:  Nothing Worsened by:  Nothing Ineffective treatments: narcotics  Associated symptoms: no back pain, no decreased ROM and no fatigue   Risk factors: no concern for non-accidental trauma        Past Medical History:  Diagnosis Date  . Allergy   . Arthritis   . BV (bacterial vaginosis)   . GERD (gastroesophageal reflux disease)   . Hypertension     Patient Active Problem List   Diagnosis Date Noted  . BV (bacterial vaginosis) 05/22/2012    Past Surgical History:  Procedure Laterality Date  . APPENDECTOMY  1958  . TOTAL VAGINAL HYSTERECTOMY       OB History    Gravida  5   Para  3   Term  3   Preterm      AB  2   Living  3     SAB  2   IAB      Ectopic      Multiple      Live Births  3           Family History  Problem Relation Age of Onset  . Hypertension Mother   . Arthritis Mother   . COPD Mother   . Hypertension Father   . Cancer Father   . Arthritis Father     Social History   Tobacco Use  . Smoking status: Never Smoker  . Smokeless tobacco: Never Used  Substance Use Topics  . Alcohol use: No  . Drug use: No    Home Medications Prior to Admission medications   Medication Sig Start Date End Date Taking? Authorizing Provider  traMADol (ULTRAM) 50 MG tablet Take 50 mg by mouth every 6 (six) hours as needed.   Yes [provider]  albuterol (PROVENTIL HFA;VENTOLIN  HFA) 108 (90 Base) MCG/ACT inhaler Inhale 1-2 puffs into the lungs every 6 (six) hours as needed for wheezing or shortness of breath. 04/12/16   Rise Mu, PA-C  esomeprazole (NEXIUM) 20 MG capsule Take 20 mg by mouth daily before breakfast.    [provider]  Fluticasone-Salmeterol (ADVAIR DISKUS) 100-50 MCG/DOSE AEPB Inhale 1 puff into the lungs every 12 (twelve) hours.    [provider]  lisinopril (PRINIVIL,ZESTRIL) 5 MG tablet Take 5 mg by mouth 2 (two) times daily.    [provider]  loratadine (CLARITIN) 10 MG tablet Take 10 mg by mouth daily.    [provider]    Allergies    Patient has no known allergies.  Review of Systems   Review of Systems  Constitutional: Negative for fatigue.  HENT: Negative for congestion.   Eyes: Negative for visual disturbance.  Respiratory: Negative for shortness of breath.   Cardiovascular: Negative for chest pain.  Gastrointestinal: Negative for abdominal pain.  Genitourinary: Negative for difficulty urinating.  Musculoskeletal: Positive for arthralgias. Negative for back pain.  Skin: Negative for wound.  Neurological: Negative for dizziness.  Psychiatric/Behavioral: Negative for agitation.  All other systems reviewed and are negative.   Physical Exam Updated Vital Signs BP (!) 148/97 (BP Location: Right Arm)   Pulse 81   Temp 98.8 F (37.1 C) (Oral)   Resp 18   Ht 5\' 2"  (1.575 m)   Wt 99.8 kg   SpO2 95%   BMI 40.24 kg/m   Physical Exam Vitals and nursing note reviewed.  Constitutional:      General: She is not in acute distress.    Appearance: Normal appearance.  HENT:     Head: Normocephalic and atraumatic.     Nose: Nose normal.  Eyes:     Conjunctiva/sclera: Conjunctivae normal.     Pupils: Pupils are equal, round, and reactive to light.  Cardiovascular:     Rate and Rhythm: Normal rate and regular rhythm.     Pulses: Normal pulses.     Heart sounds: Normal heart sounds.   Pulmonary:     Effort: Pulmonary effort is normal.     Breath sounds: Normal breath sounds.  Abdominal:     General: Abdomen is flat. Bowel sounds are normal.     Palpations: Abdomen is soft.     Tenderness: There is no abdominal tenderness. There is no guarding.  Musculoskeletal:     Cervical back: Normal range of motion and neck supple.     Right knee: Normal.     Instability Tests: Posterior drawer test negative. Anterior Lachman test negative. Medial McMurray test negative and lateral McMurray test negative.     Left knee: Normal.     Right lower leg: Normal.     Left lower leg: Normal.     Right ankle: Normal.     Right Achilles Tendon: Normal.     Left ankle: Normal.     Left Achilles Tendon: Normal.  Skin:    General: Skin is warm and dry.     Capillary Refill: Capillary refill takes less than 2 seconds.  Neurological:     General: No focal deficit present.     Mental Status: She is alert and oriented to person, place, and time.     Deep Tendon Reflexes: Reflexes normal.  Psychiatric:        Mood and Affect: Mood normal.        Behavior: Behavior normal.     ED Results / Procedures / Treatments   Labs (all labs ordered are listed, but only abnormal results are displayed) Labs Reviewed - No data to display  EKG None  Radiology DG Knee Complete 4 Views Right  Result Date: 06/21/2020 CLINICAL DATA:  Right knee pain, no known injury, initial encounter EXAM: RIGHT KNEE - COMPLETE 4+ VIEW COMPARISON:  None. FINDINGS: Mild degenerative changes of the patellofemoral joint space and medial joint space are seen. No acute fracture or dislocation is noted. IMPRESSION: No acute abnormality noted.  Mild degenerative changes are seen. Electronically Signed   By: 06/23/2020 M.D.   On: 06/21/2020 23:27    Procedures Procedures   Medications Ordered in ED Medications  lidocaine (LIDODERM) 5 % 1 patch (1 patch Transdermal Patch Applied 06/21/20 2328)    ED Course  I have  reviewed the triage vital signs and the nursing notes.  Pertinent labs & imaging results that were available during my care of the patient were reviewed by me and considered in my  medical decision making (see chart for details).   Ongoing knee pain will refer to orthopedics.  NSAIDs and lidoderm.     Leah Aguilar was evaluated in Emergency Department on 06/21/2020 for the symptoms described in the history of present illness. She was evaluated in the context of the global COVID-19 pandemic, which necessitated consideration that the patient might be at risk for infection with the SARS-CoV-2 virus that causes COVID-19. Institutional protocols and algorithms that pertain to the evaluation of patients at risk for COVID-19 are in a state of rapid change based on information released by regulatory bodies including the CDC and federal and state organizations. These policies and algorithms were followed during the patient's care in the ED.  Final Clinical Impression(s) / ED Diagnoses Final diagnoses:  None    Return for intractable cough, coughing up blood, fevers >100.4 unrelieved by medication, shortness of breath, intractable vomiting, chest pain, shortness of breath, weakness, numbness, changes in speech, facial asymmetry, abdominal pain, passing out, Inability to tolerate liquids or food, cough, altered mental status or any concerns. No signs of systemic illness or infection. The patient is nontoxic-appearing on exam and vital signs are within normal limits.  I have reviewed the triage vital signs and the nursing notes. Pertinent labs & imaging results that were available during my care of the patient were reviewed by me and considered in my medical decision making (see chart for details). After history, exam, and medical workup I feel the patient has been appropriately medically screened and is safe for discharge home. Pertinent diagnoses were discussed with the patient. Patient was given return  precautions. Rx / DC Orders ED Discharge Orders    None       Keldon Lassen, MD 06/21/20 2343

## 2020-06-21 NOTE — ED Triage Notes (Signed)
Pt. Saw her Dr in Jan. 2022 for this same L knee and the same pain.  Pt. Was given meds and uses cane but having pain in the Knee and down the leg with walking.  Pt. Reports she hurts most with walking.  Pt. Has not seen an ortho for this.  Pt. Has had an ultrasound for this.

## 2021-04-17 ENCOUNTER — Other Ambulatory Visit: Payer: Self-pay

## 2021-04-17 ENCOUNTER — Emergency Department (HOSPITAL_BASED_OUTPATIENT_CLINIC_OR_DEPARTMENT_OTHER)
Admission: EM | Admit: 2021-04-17 | Discharge: 2021-04-17 | Disposition: A | Discharge status: Home / Self Care | Payer: Medicare Other | Attending: Emergency Medicine | Admitting: Emergency Medicine

## 2021-04-17 ENCOUNTER — Encounter (HOSPITAL_BASED_OUTPATIENT_CLINIC_OR_DEPARTMENT_OTHER): Payer: Self-pay | Admitting: Emergency Medicine

## 2021-04-17 ENCOUNTER — Emergency Department (HOSPITAL_BASED_OUTPATIENT_CLINIC_OR_DEPARTMENT_OTHER): Payer: Medicare Other

## 2021-04-17 DIAGNOSIS — K279 Peptic ulcer, site unspecified, unspecified as acute or chronic, without hemorrhage or perforation: Secondary | ICD-10-CM | POA: Diagnosis not present

## 2021-04-17 DIAGNOSIS — R072 Precordial pain: Secondary | ICD-10-CM | POA: Insufficient documentation

## 2021-04-17 DIAGNOSIS — R7309 Other abnormal glucose: Secondary | ICD-10-CM | POA: Insufficient documentation

## 2021-04-17 DIAGNOSIS — R0789 Other chest pain: Secondary | ICD-10-CM | POA: Diagnosis present

## 2021-04-17 LAB — CBC
HCT: 38.6 % (ref 36.0–46.0)
Hemoglobin: 13 g/dL (ref 12.0–15.0)
MCH: 32 pg (ref 26.0–34.0)
MCHC: 33.7 g/dL (ref 30.0–36.0)
MCV: 95.1 fL (ref 80.0–100.0)
Platelets: 227 10*3/uL (ref 150–400)
RBC: 4.06 MIL/uL (ref 3.87–5.11)
RDW: 12.8 % (ref 11.5–15.5)
WBC: 5.1 10*3/uL (ref 4.0–10.5)
nRBC: 0 % (ref 0.0–0.2)

## 2021-04-17 LAB — BASIC METABOLIC PANEL
Anion gap: 8 (ref 5–15)
BUN: 13 mg/dL (ref 8–23)
CO2: 24 mmol/L (ref 22–32)
Calcium: 8.9 mg/dL (ref 8.9–10.3)
Chloride: 105 mmol/L (ref 98–111)
Creatinine, Ser: 0.94 mg/dL (ref 0.44–1.00)
GFR, Estimated: >60 mL/min (ref >60)
Glucose, Bld: 104 mg/dL — ABNORMAL HIGH (ref 70–99)
Potassium: 4.2 mmol/L (ref 3.5–5.1)
Sodium: 137 mmol/L (ref 135–145)

## 2021-04-17 LAB — CBG MONITORING, ED: Glucose-Capillary: 102 mg/dL — ABNORMAL HIGH (ref 70–99)

## 2021-04-17 LAB — TROPONIN I (HIGH SENSITIVITY)
Troponin I (High Sensitivity): 2 ng/L (ref <18)
Troponin I (High Sensitivity): 2 ng/L (ref <18)

## 2021-04-17 MED ORDER — ALUM & MAG HYDROXIDE-SIMETH 200-200-20 MG/5ML PO SUSP
30.0000 mL | Freq: Once | ORAL | Status: AC
Start: 2021-04-17 — End: 2021-04-17
  Administered 2021-04-17: 30 mL via ORAL
  Filled 2021-04-17: qty 30

## 2021-04-17 MED ORDER — NITROGLYCERIN 0.4 MG SL SUBL
0.4000 mg | SUBLINGUAL_TABLET | SUBLINGUAL | Status: DC | PRN
  Administered 2021-04-17: 0.4 mg via SUBLINGUAL
  Filled 2021-04-17: qty 1

## 2021-04-17 MED ORDER — ALUM & MAG HYDROXIDE-SIMETH 400-400-40 MG/5ML PO SUSP: 10.0000 mL | Freq: Four times a day (QID) | ORAL | 0 refills | Status: AC | PRN

## 2021-04-17 MED ORDER — ASPIRIN 81 MG PO CHEW
324.0000 mg | CHEWABLE_TABLET | Freq: Once | ORAL | Status: AC
  Administered 2021-04-17: 324 mg via ORAL
  Filled 2021-04-17: qty 4

## 2021-04-17 NOTE — Discharge Instructions (Signed)
We saw you in the ER for the chest pain/shortness of breath. All of our cardiac workup is normal, including labs, EKG and chest X-RAY are normal.  We are not sure what is causing your discomfort, but at this time, we suspect that the symptoms are because of gastritis or esophagitis.  Take the medications prescribed.  Read instructions provided.  Follow-up with the GI doctor as planned for the upper endoscopy.  If your endoscopy result is completely normal, then please call the cardiologist at the number provided to set up an appointment.  Please return to the ER if you have worsening chest pain, shortness of breath, pain radiating to your jaw, shoulder, or back, sweats or fainting.

## 2021-04-17 NOTE — ED Provider Notes (Signed)
[place] EMERGENCY DEPARTMENT Provider Note   CSN: [id number] Arrival date & time: 04/17/21  0158     History  Chief Complaint  Patient presents with   Chest Pain    Leah Aguilar is a 71 y.o. female.  HPI  HPI: A 71 year old patient with a history of hypertension, hypercholesterolemia and obesity presents for evaluation of chest pain. Initial onset of pain was approximately 3-6 hours ago. The patient's chest pain is described as heaviness/pressure/tightness and is not worse with exertion. The patient complains of nausea. The patient's chest pain is middle- or left-sided, is not well-localized, is not sharp and does not radiate to the arms/jaw/neck. The patient denies diaphoresis. The patient has no history of stroke, has no history of peripheral artery disease, has not smoked in the past 90 days, denies any history of treated diabetes and has no relevant family history of coronary artery disease (first degree relative at less than age 37).   Pain started at 9 PM.  Central, tightness type chest pain, nonradiating.  Feels more like gastritis type pain, but patient did not have much to eat. Indicates that she has had epigastric abd pain for some time and is due for endoscopy on 25th.   Home Medications Prior to Admission medications   Medication Sig Start Date End Date Taking? Authorizing Provider  alum & mag hydroxide-simeth (MAALOX PLUS) 400-400-40 MG/5ML suspension Take 10 mLs by mouth every 6 (six) hours as needed for indigestion. 04/17/21  Yes Varney Biles, MD  albuterol (PROVENTIL HFA;VENTOLIN HFA) 108 (90 Base) MCG/ACT inhaler Inhale 1-2 puffs into the lungs every 6 (six) hours as needed for wheezing or shortness of breath. 04/12/16   Doristine Devoid, PA-C  esomeprazole (NEXIUM) 20 MG capsule Take 20 mg by mouth daily before breakfast.    [provider]  Fluticasone-Salmeterol (ADVAIR DISKUS) 100-50 MCG/DOSE AEPB Inhale 1 puff into the lungs every 12  (twelve) hours.    [provider]  lidocaine (LIDODERM) 5 % Place 1 patch onto the skin daily. Remove & Discard patch within 12 hours or as directed by MD 06/21/20   Randal Buba, April, MD  lisinopril (PRINIVIL,ZESTRIL) 5 MG tablet Take 5 mg by mouth 2 (two) times daily.    [provider]  loratadine (CLARITIN) 10 MG tablet Take 10 mg by mouth daily.    [provider]  traMADol (ULTRAM) 50 MG tablet Take 50 mg by mouth every 6 (six) hours as needed.    [provider]      Allergies    Patient has no known allergies.    Review of Systems   Review of Systems  Physical Exam Updated Vital Signs BP 126/83    Pulse (!) 56    Temp 98.1 F (36.7 C) (Oral)    Resp (!) 21    Ht 5\' 2"  (1.575 m)    Wt 105.7 kg    SpO2 97%    BMI 42.62 kg/m  Physical Exam Vitals and nursing note reviewed.  Constitutional:      Appearance: She is well-developed.  HENT:     Head: Atraumatic.  Cardiovascular:     Rate and Rhythm: Normal rate.     Heart sounds: Normal heart sounds.  Pulmonary:     Effort: Pulmonary effort is normal.  Musculoskeletal:     Cervical back: Normal range of motion and neck supple.     Right lower leg: No tenderness. No edema.     Left lower leg:  No tenderness. No edema.  Skin:    General: Skin is warm and dry.  Neurological:     Mental Status: She is alert and oriented to person, place, and time.    ED Results / Procedures / Treatments   Labs (all labs ordered are listed, but only abnormal results are displayed) Labs Reviewed  BASIC METABOLIC PANEL - Abnormal; Notable for the following components:      Result Value   Glucose, Bld 104 (*)    All other components within normal limits  CBG MONITORING, ED - Abnormal; Notable for the following components:   Glucose-Capillary 102 (*)    All other components within normal limits  CBC  TROPONIN I (HIGH SENSITIVITY)  TROPONIN I (HIGH SENSITIVITY)    EKG EKG  Interpretation  Date/Time:  Sunday April 17 2021 02:06:03 EST Ventricular Rate:  64 PR Interval:  171 QRS Duration: 84 QT Interval:  380 QTC Calculation: 392 R Axis:   10 Text Interpretation: Sinus rhythm Low voltage, precordial leads No acute changes No old tracing to compare Confirmed by Varney Biles [phone]) on 04/17/2021 2:26:54 AM  Radiology DG Chest Portable 1 View  Result Date: 04/17/2021 CLINICAL DATA:  Chest pain EXAM: PORTABLE CHEST 1 VIEW COMPARISON:  05/23/2016 FINDINGS: Lungs are clear. No pneumothorax or pleural effusion. Cardiac size is within normal limits. The thoracic aorta is tortuous, unchanged. Pulmonary vascularity is normal. IMPRESSION: No active disease. Electronically Signed   By: Fidela Salisbury M.D.   On: 04/17/2021 02:59    Procedures Procedures    Medications Ordered in ED Medications  nitroGLYCERIN (NITROSTAT) SL tablet 0.4 mg (0.4 mg Sublingual Given 04/17/21 0241)  aspirin chewable tablet 324 mg (324 mg Oral Given 04/17/21 0241)  alum & mag hydroxide-simeth (MAALOX/MYLANTA) 200-200-20 MG/5ML suspension 30 mL (30 mLs Oral Given 04/17/21 0357)    ED Course/ Medical Decision Making/ A&P Clinical Course as of 04/17/21 [id number]  Sun Apr 17, 2021  0400 Patient's initial troponin is normal.  Results discussed with the patient.  Patient indicated that the nitroglycerin did not help her.  The GI cocktail did help her.  Second troponin pending at this time. [AN]    Clinical Course User Index [AN] Varney Biles, MD   HEAR Score: 5                       Medical Decision Making Number and complexity of problems evaluated during this encounter: - Moderate: 1 undiagnosed new problem with uncertain prognosis   Amount and/or Complexity of Data: - Unique tests ordered including labs and imaging: > 3 - Test reviewed (review of imaging, EKGs): EKG, Chest Xray - EKG is NSR, no signs of ischemia and CXR has no ptx or pna. - Prior notes reviewed: [facility]  pulmonary visit - Independent interpretation of imaging, completed with scope of interpretation limited to determining acute life threatening conditions related to emergency care.  Risk of Morbidity, Mortality, or Complications - Risk level: Low   - Risk level: minimal      Problems Addressed: Precordial chest pain: complicated acute illness or injury with systemic symptoms  Amount and/or Complexity of Data Reviewed Labs: ordered. Radiology: ordered.  Risk OTC drugs. Prescription drug management. Decision regarding hospitalization.   71 year old female comes in with chief complaint of midsternal chest pain that is nonradiating, and present constantly since 9 PM with no specific aggravating or relieving factors.  She has history of hypertension, hyperlipidemia.  She does not have  any known coronary artery disease.    Differential diagnosis includes: ACS syndrome Aortic dissection CHF exacerbation Valvular disorder Myocarditis Pericarditis Endocarditis Pericardial effusion / tamponade Pneumonia Pleural effusion / Pulmonary edema PE Pneumothorax Musculoskeletal pain PUD / Gastritis / Esophagitis Esophageal spasm  Hear score calculated and it is at 5.  Initial EKG is not showing any evidence of acute ischemia and troponins have been sent.  Chest x-ray reviewed independently, no evidence of pneumothorax, pneumonia there is no mediastinal widening or large pleural effusion.  Patient's symptoms are atypical for ACS, but given her gender and age, atypical could be typical -therefore troponin x2 ordered.  Suspicion is low higher for gastritis/esophagitis -so we will give patient aspirin and nitroglycerin, reassess her.  If the pain does not improve with nitro then we will give her GI cocktail and reassess.  6:23 AM Pain improves significantly w/ mylanta. Trops x 2 normal. Suspicion higher for the symptoms being pud related. Given that there is GI f/u coming up and the  trops are normal and pretest probability is lower for cardiac etiology - despite HEART score of 5, we feel comfortable with not admitting patient for further cardiac workup. Discussed results with the patient and she is also comfortable with the plan. Strict ER return precautions have been discussed, and patient is agreeing with the plan and is comfortable with the workup done and the recommendations from the ER.   Final Clinical Impression(s) / ED Diagnoses Final diagnoses:  Precordial chest pain  PUD (peptic ulcer disease)    Rx / DC Orders ED Discharge Orders          Ordered    alum & mag hydroxide-simeth (MAALOX PLUS) [id number] MG/5ML suspension  Every 6 hours PRN        04/17/21 [id number]              Varney Biles, MD 04/17/21 [phone]

## 2021-04-17 NOTE — ED Triage Notes (Signed)
Reports sharp central chest pain that started around 9pm.  Also endorses feeling lightheaded with this pain.  Also c/o n/v.  Vomited X 3.

## 2021-10-26 IMAGING — DX DG KNEE COMPLETE 4+V*R*
4 series · 4 of 4 positions shown · non-contrast
Comparison: None.

CLINICAL DATA: Right knee pain, no known injury, initial encounter

EXAM:
RIGHT KNEE - COMPLETE 4+ VIEW

[knee ap]
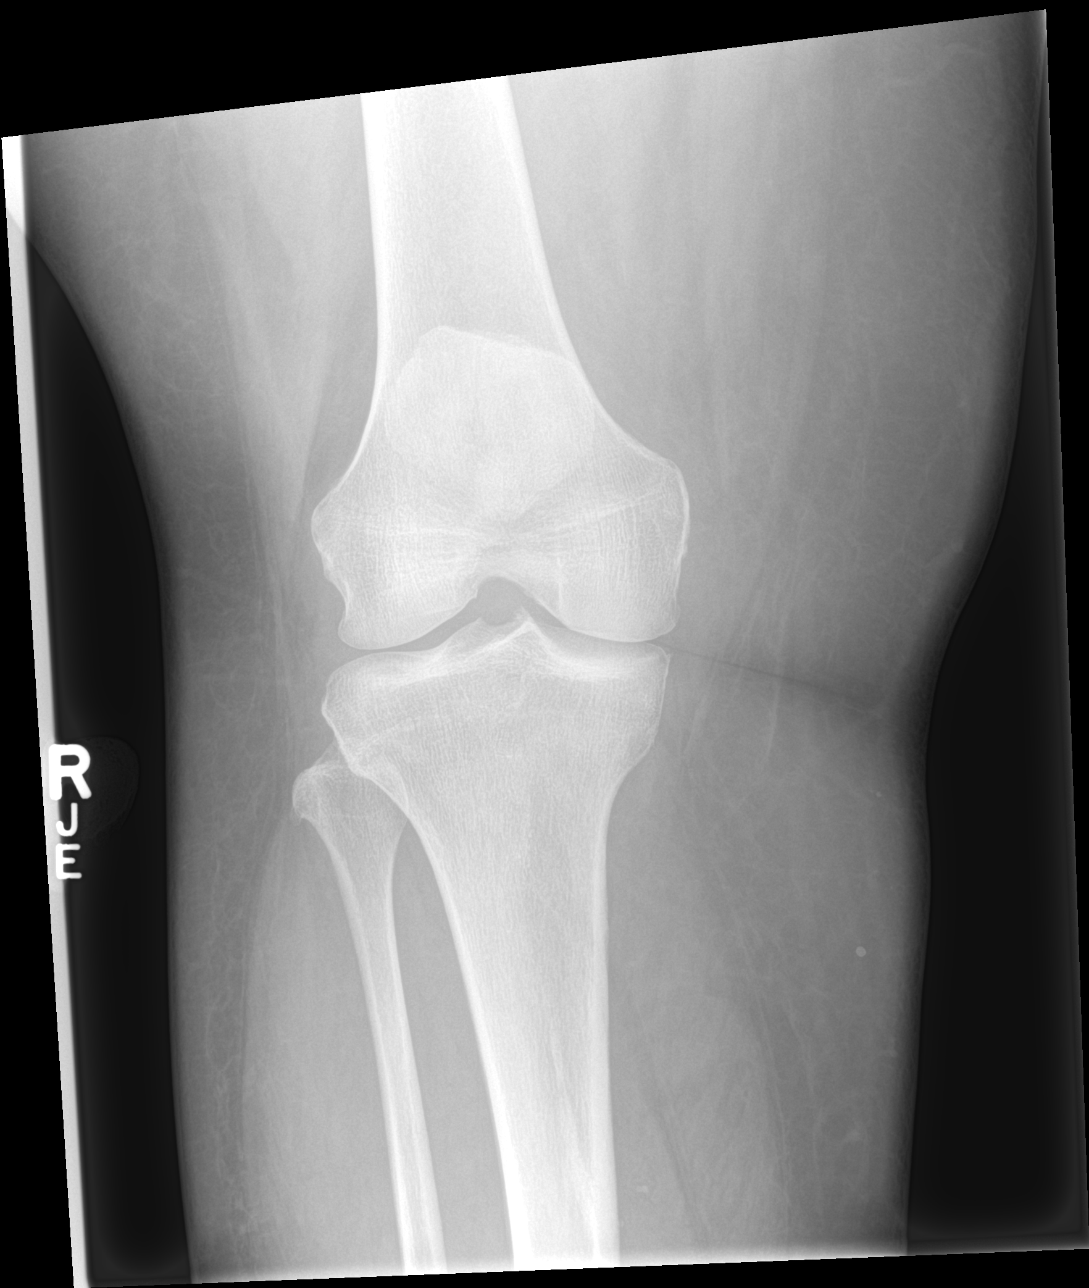

[knee lat]
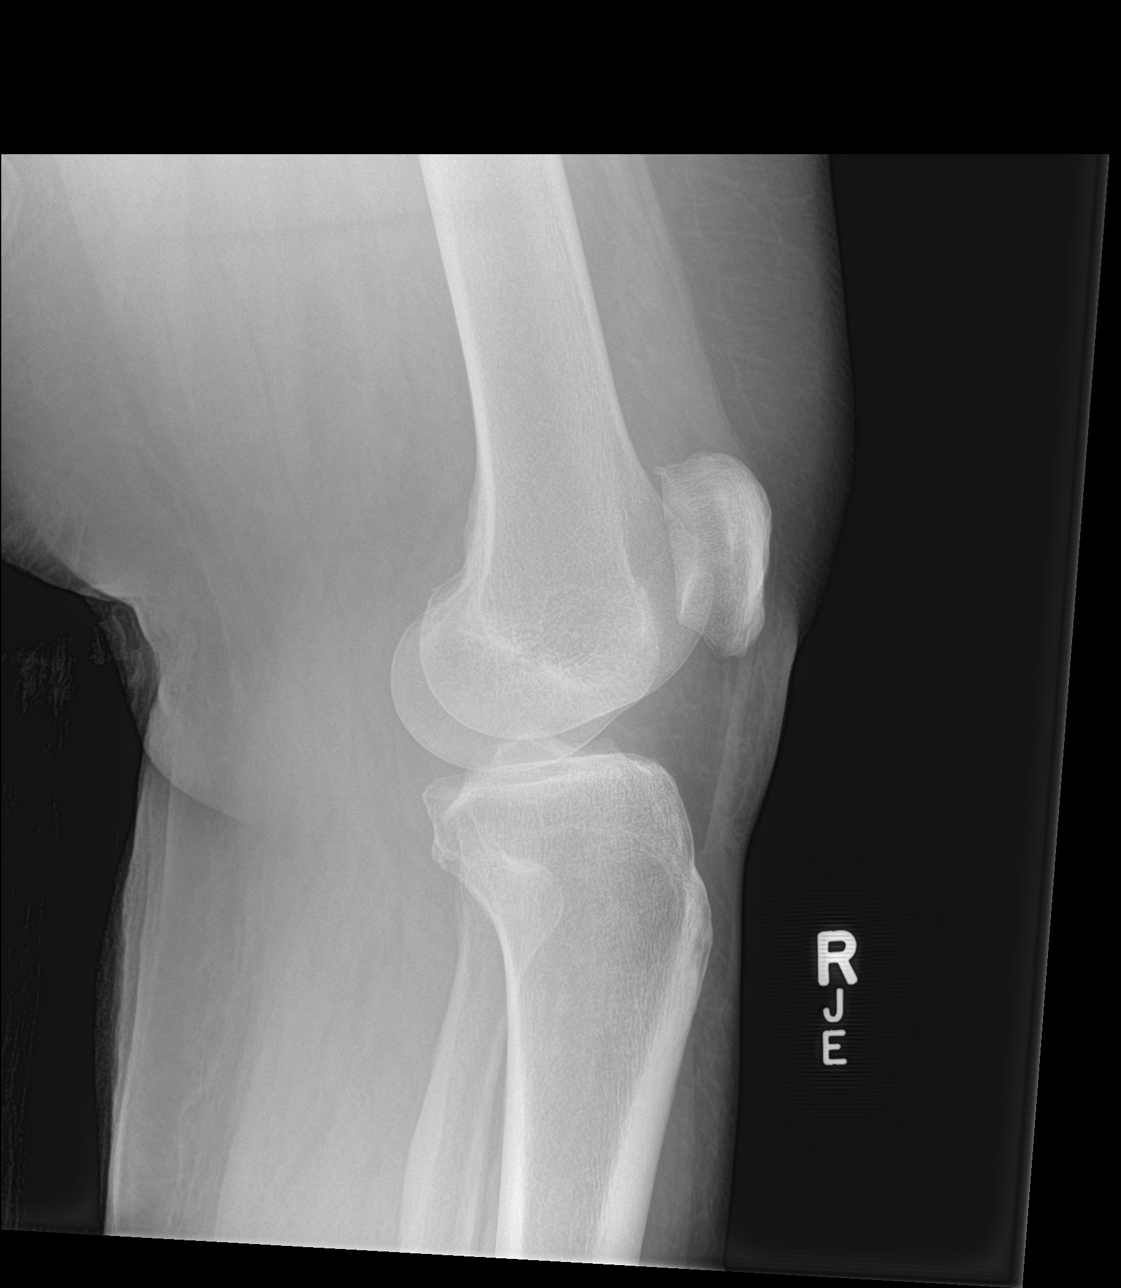

[knee obl (1 of 2)]
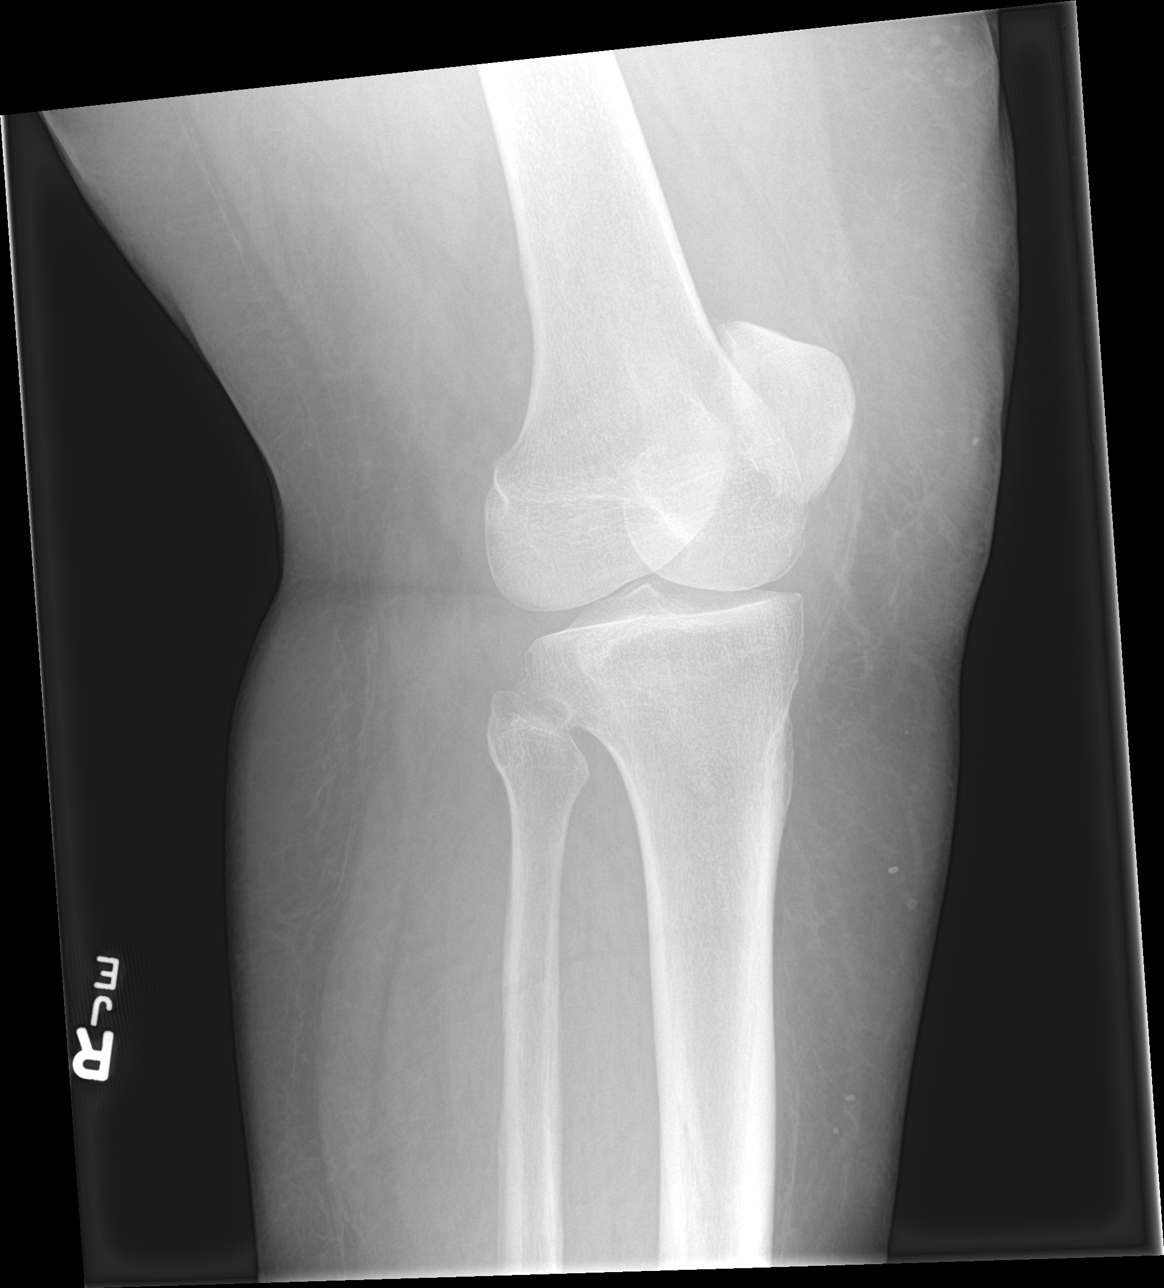

[knee obl (2 of 2)]
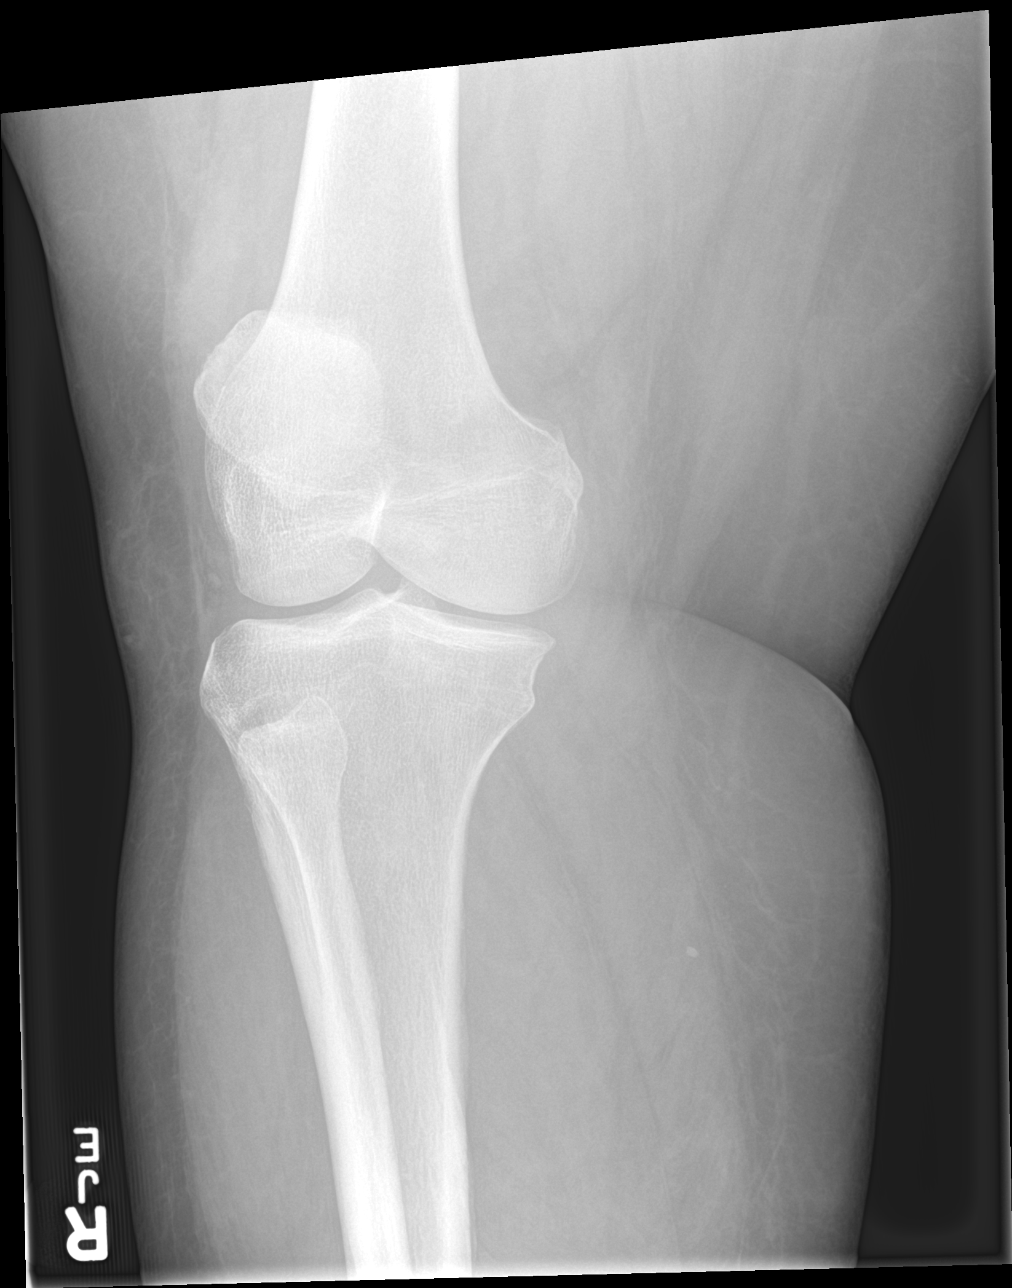

[4 of 4 positions shown; findings below may reference images not displayed]

FINDINGS: Mild degenerative changes of the patellofemoral joint space and
medial joint space are seen. No acute fracture or dislocation is
noted.
IMPRESSION: No acute abnormality noted.  Mild degenerative changes are seen.

## 2022-08-22 IMAGING — DX DG CHEST 1V PORT
1 series · 1 of 1 positions shown · non-contrast
Comparison: 05/23/2016

CLINICAL DATA: Chest pain

EXAM:
PORTABLE CHEST 1 VIEW

[chest ap]
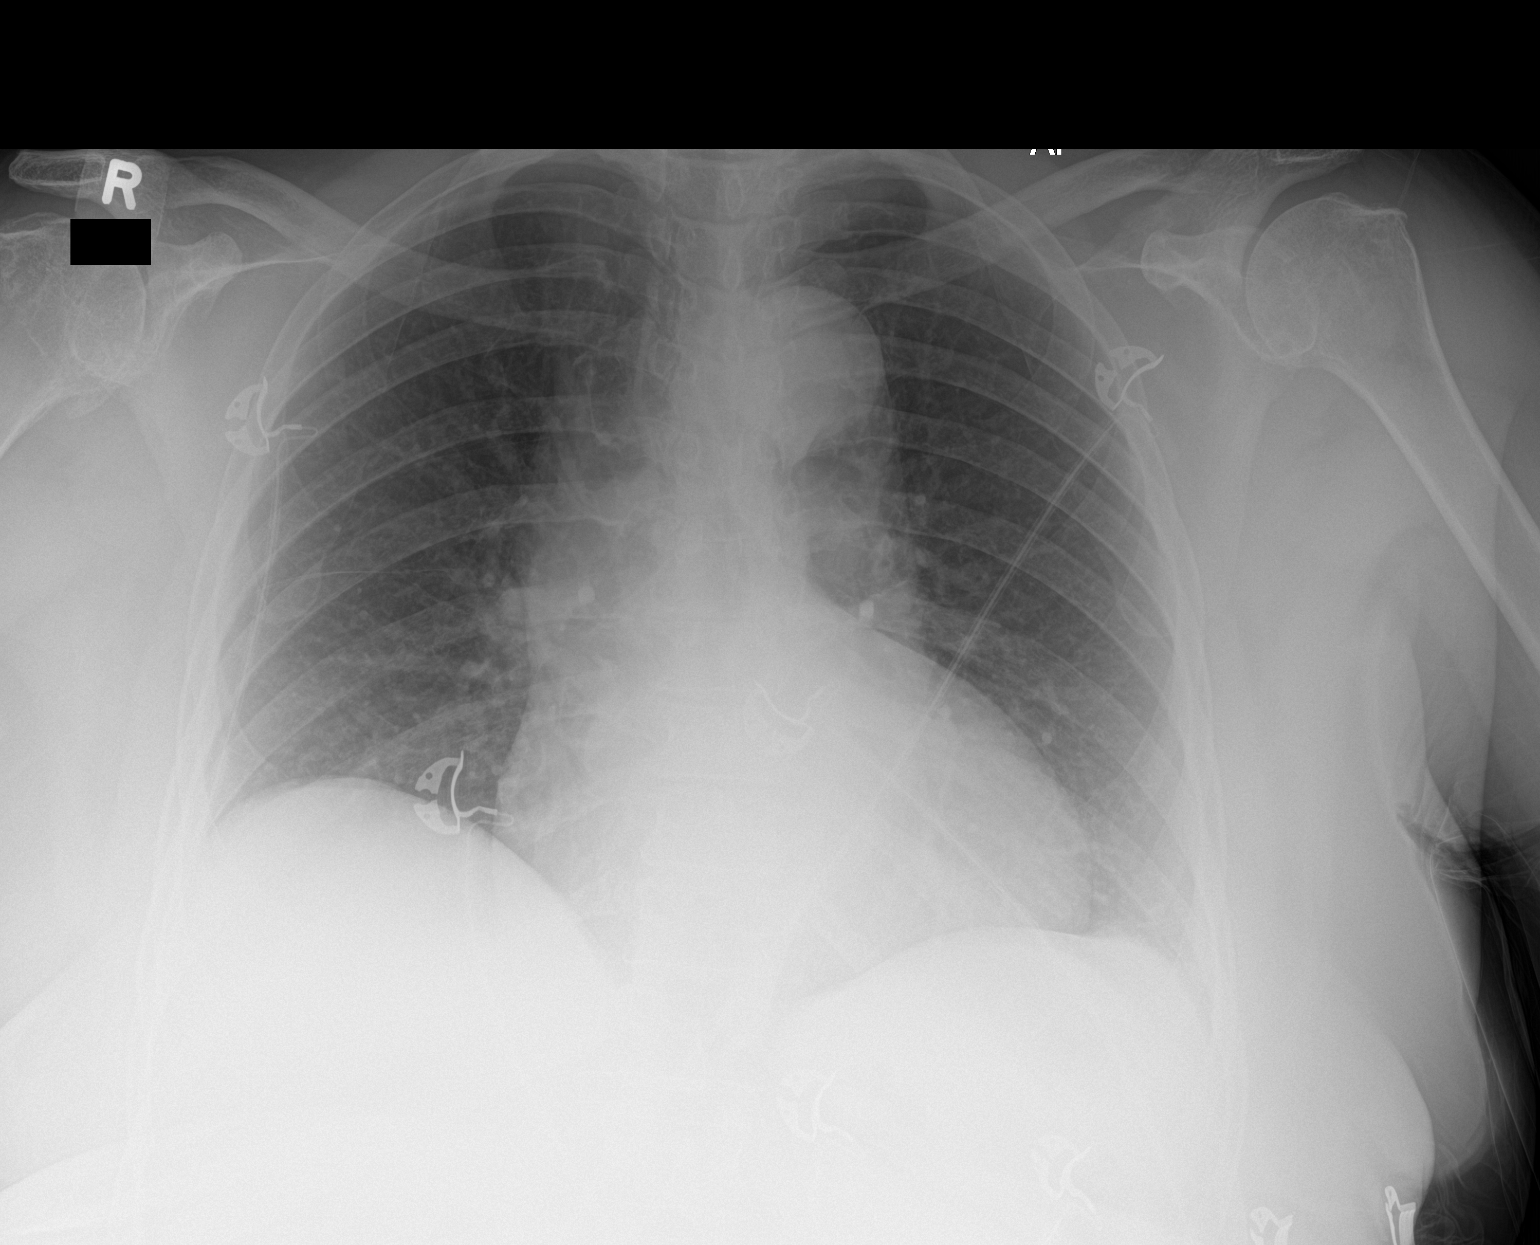

[1 of 1 positions shown; findings below may reference images not displayed]

FINDINGS: Lungs are clear. No pneumothorax or pleural effusion. Cardiac size
is within normal limits. The thoracic aorta is tortuous, unchanged.
Pulmonary vascularity is normal.
IMPRESSION: No active disease.
